# Patient Record
Sex: Male | Born: 1935 | Race: White | Hispanic: No | Marital: Married | Smoking: Former smoker
Health system: Southern US, Community
[De-identification: ages and names within clinical notes are randomized; demographics above are authoritative.]

## PROBLEM LIST (undated history)

## (undated) DIAGNOSIS — G4730 Sleep apnea, unspecified: Secondary | ICD-10-CM

## (undated) DIAGNOSIS — J449 Chronic obstructive pulmonary disease, unspecified: Secondary | ICD-10-CM

## (undated) DIAGNOSIS — I10 Essential (primary) hypertension: Secondary | ICD-10-CM

## (undated) DIAGNOSIS — E7800 Pure hypercholesterolemia, unspecified: Secondary | ICD-10-CM

## (undated) DIAGNOSIS — I272 Pulmonary hypertension, unspecified: Secondary | ICD-10-CM

## (undated) DIAGNOSIS — G629 Polyneuropathy, unspecified: Secondary | ICD-10-CM

## (undated) DIAGNOSIS — I495 Sick sinus syndrome: Secondary | ICD-10-CM

## (undated) DIAGNOSIS — I50810 Right heart failure, unspecified: Secondary | ICD-10-CM

## (undated) DIAGNOSIS — I2510 Atherosclerotic heart disease of native coronary artery without angina pectoris: Secondary | ICD-10-CM

## (undated) HISTORY — DX: Essential (primary) hypertension: I10

## (undated) HISTORY — DX: Atherosclerotic heart disease of native coronary artery without angina pectoris: I25.10

## (undated) HISTORY — DX: Right heart failure, unspecified: I50.810

## (undated) HISTORY — PX: OTHER SURGICAL HISTORY: SHX169

## (undated) HISTORY — DX: Pure hypercholesterolemia, unspecified: E78.00

## (undated) HISTORY — DX: Sleep apnea, unspecified: G47.30

## (undated) HISTORY — DX: Chronic obstructive pulmonary disease, unspecified: J44.9

## (undated) HISTORY — DX: Polyneuropathy, unspecified: G62.9

## (undated) HISTORY — DX: Sick sinus syndrome: I49.5

## (undated) HISTORY — DX: Pulmonary hypertension, unspecified: I27.20

---

## 1998-12-31 ENCOUNTER — Ambulatory Visit: Admission: RE | Admit: 1998-12-31 | Discharge: 1998-12-31 | Payer: Self-pay | Admitting: Family Medicine

## 1999-10-03 ENCOUNTER — Inpatient Hospital Stay (HOSPITAL_COMMUNITY): Admission: EM | Admit: 1999-10-03 | Discharge: 1999-10-10 | Payer: Self-pay | Admitting: Emergency Medicine

## 1999-10-03 ENCOUNTER — Encounter: Payer: Self-pay | Admitting: Emergency Medicine

## 1999-10-03 HISTORY — PX: CARDIAC CATHETERIZATION: SHX172

## 1999-10-04 ENCOUNTER — Encounter: Payer: Self-pay | Admitting: Cardiothoracic Surgery

## 1999-10-04 HISTORY — PX: CORONARY ARTERY BYPASS GRAFT: SHX141

## 1999-10-05 ENCOUNTER — Encounter: Payer: Self-pay | Admitting: Cardiothoracic Surgery

## 1999-10-06 ENCOUNTER — Encounter: Payer: Self-pay | Admitting: Cardiothoracic Surgery

## 1999-10-07 ENCOUNTER — Encounter: Payer: Self-pay | Admitting: Cardiothoracic Surgery

## 1999-10-08 ENCOUNTER — Encounter: Payer: Self-pay | Admitting: Cardiothoracic Surgery

## 1999-10-09 ENCOUNTER — Encounter: Payer: Self-pay | Admitting: Cardiothoracic Surgery

## 1999-10-10 ENCOUNTER — Encounter: Payer: Self-pay | Admitting: Cardiothoracic Surgery

## 1999-11-23 ENCOUNTER — Encounter (HOSPITAL_COMMUNITY): Admission: RE | Admit: 1999-11-23 | Discharge: 2000-02-21 | Payer: Self-pay | Admitting: Cardiology

## 2001-06-05 ENCOUNTER — Ambulatory Visit (HOSPITAL_COMMUNITY): Admission: RE | Admit: 2001-06-05 | Discharge: 2001-06-05 | Payer: Self-pay | Admitting: Gastroenterology

## 2008-04-11 ENCOUNTER — Ambulatory Visit (HOSPITAL_COMMUNITY): Admission: RE | Admit: 2008-04-11 | Discharge: 2008-04-12 | Payer: Self-pay | Admitting: *Deleted

## 2008-11-20 ENCOUNTER — Ambulatory Visit: Admission: RE | Admit: 2008-11-20 | Discharge: 2008-11-20 | Payer: Self-pay | Admitting: Orthopedic Surgery

## 2009-12-07 ENCOUNTER — Ambulatory Visit: Payer: Self-pay | Admitting: Cardiology

## 2010-03-01 ENCOUNTER — Encounter: Payer: Self-pay | Admitting: Internal Medicine

## 2010-03-15 ENCOUNTER — Ambulatory Visit: Payer: Self-pay | Admitting: Cardiology

## 2010-04-07 ENCOUNTER — Ambulatory Visit: Payer: Self-pay | Admitting: Internal Medicine

## 2010-05-25 NOTE — Miscellaneous (Signed)
Summary: Device preload  Clinical Lists Changes  Observations: Added new observation of PPM INDICATN: A-flutter (03/01/2010 8:58) Added new observation of MAGNET RTE: BOL 85 ERI 65 (03/01/2010 8:58) Added new observation of PPMLEADSTAT2: active (03/01/2010 8:58) Added new observation of PPMLEADSER2: [id number] (03/01/2010 8:58) Added new observation of PPMLEADMOD2: 5076  (03/01/2010 8:58) Added new observation of PPMLEADDOI2: 04/11/2008  (03/01/2010 8:58) Added new observation of PPMLEADLOC2: RV  (03/01/2010 8:58) Added new observation of PPMLEADSTAT1: active  (03/01/2010 8:58) Added new observation of PPMLEADSER1: [id number]  (03/01/2010 8:58) Added new observation of PPMLEADMOD1: 5076  (03/01/2010 8:58) Added new observation of PPMLEADDOI1: 04/11/2008  (03/01/2010 8:58) Added new observation of PPMLEADLOC1: RA  (03/01/2010 8:58) Added new observation of PPM DOI: 04/11/2008  (03/01/2010 8:58) Added new observation of PPM SERL#: [id number]  (03/01/2010 8:58) Added new observation of PPM MODL#: [id number]  (03/01/2010 8:58) Added new observation of PACEMAKERMFG: Medtronic  (03/01/2010 8:58) Added new observation of PPM IMP MD: Charlynn Court  (03/01/2010 8:58) Added new observation of PPM REFER MD: Vonna Drafts  (03/01/2010 8:58) Added new observation of PACEMAKER MD: Sherryl Manges, MD  (03/01/2010 8:58)      PPM Specifications Following MD:  Sherryl Manges, MD     Referring MD:  Vonna Drafts PPM Vendor:  Medtronic     PPM Model Number:  [id number]     PPM Serial Number:  [id number] PPM DOI:  04/11/2008     PPM Implanting MD:  Charlynn Court  Lead 1    Location: RA     DOI: 04/11/2008     Model #: [id number]     Serial #: [id number]     Status: active Lead 2    Location: RV     DOI: 04/11/2008     Model #: [id number]     Serial #: [id number]     Status: active  Magnet Response Rate:  BOL 85 ERI 65  Indications:  A-flutter

## 2010-05-27 NOTE — Procedures (Signed)
Summary: pacer check/ medtronic   Current Medications (verified): 1)  Pravastatin Sodium 40 Mg Tabs (Pravastatin Sodium) .... Take 2 Tablet By Mouth Once Daily 2)  Clonazepam 1 Mg Tabs (Clonazepam) .... Take 1/2 To 2 Tablet By Mouth in The Evening 3)  Metoprolol Tartrate 50 Mg Tabs (Metoprolol Tartrate) .... Take 1/2 Tablet By Mouth Two Times A Day 4)  Pradaxa 75 Mg Caps (Dabigatran Etexilate Mesylate) .... Take 1 Tablet By Mouth Two Times A Day 5)  Fish Oil 1000 Mg Caps (Omega-3 Fatty Acids) .... Take 1 Capsule By Mouth Two Times A Day 6)  Niacin 100 Mg Tabs (Niacin) .... Take 1 Tablet By Mouth Once Daily 7)  Centrum Silver  Tabs (Multiple Vitamins-Minerals) .... Take 1 Tablet By Mouth Once Daily 8)  Iron 325 (65 Fe) Mg Tabs (Ferrous Sulfate) .... Take 1 Tablet By Mouth Once Daily  Allergies (verified): No Known Drug Allergies  PPM Specifications Following MD:  Sherryl Manges, MD     Referring MD:  Vonna Drafts PPM Vendor:  Medtronic     PPM Model Number:  [id number]     PPM Serial Number:  [id number] PPM DOI:  04/11/2008     PPM Implanting MD:  Charlynn Court  Lead 1    Location: RA     DOI: 04/11/2008     Model #: [id number]     Serial #: [id number]     Status: active Lead 2    Location: RV     DOI: 04/11/2008     Model #: [id number]     Serial #: [id number]     Status: active  Magnet Response Rate:  BOL 85 ERI 65  Indications:  A-flutter   PPM Follow Up Battery Voltage:  2.99 V       PPM Device Measurements Atrium  Amplitude: 1.5 mV, Impedance: 648 ohms,  Right Ventricle  Amplitude: PACED mV, Impedance: 600 ohms, Threshold: 0.5 V at 0.4 msec  Episodes MS Episodes:  2     Percent Mode Switch:  100%     Ventricular High Rate:  0     Atrial Pacing:  1.0%     Ventricular Pacing:  96.1%  Parameters Mode:  MVP     Lower Rate Limit:  60     Upper Rate Limit:  130 Paced AV Delay:  200     Sensed AV Delay:  170 Next Cardiology Appt Due:  07/12/2010 Tech Comments:  [place] [facility] PT--- PT IN AF  100% OF TIME. + PRADAXA.  NORMAL DEVICE FUNCTION.  NO CHANGES MADE. PT WOULD LIKE TO BE ENROLLED IN CARELINK. ROV 07-12-10 @ 1330 W/SK.  PT TO START CARELINK TRANSMISSIONS AFTER OV W/SK. Vella Kohler  April 07, 2010 3:12 PM

## 2010-07-12 ENCOUNTER — Encounter: Payer: Self-pay | Admitting: Internal Medicine

## 2010-08-18 ENCOUNTER — Other Ambulatory Visit: Payer: Self-pay | Admitting: Family Medicine

## 2010-08-18 ENCOUNTER — Ambulatory Visit
Admission: RE | Admit: 2010-08-18 | Discharge: 2010-08-18 | Disposition: A | Discharge status: Home / Self Care | Payer: Medicare Other | Source: Ambulatory Visit | Attending: Family Medicine | Admitting: Family Medicine

## 2010-08-18 DIAGNOSIS — G629 Polyneuropathy, unspecified: Secondary | ICD-10-CM

## 2010-08-18 DIAGNOSIS — R52 Pain, unspecified: Secondary | ICD-10-CM

## 2010-08-21 ENCOUNTER — Encounter: Payer: Self-pay | Admitting: Internal Medicine

## 2010-08-24 ENCOUNTER — Encounter: Payer: Self-pay | Admitting: Internal Medicine

## 2010-08-24 ENCOUNTER — Ambulatory Visit (INDEPENDENT_AMBULATORY_CARE_PROVIDER_SITE_OTHER): Payer: Medicare Other | Admitting: Internal Medicine

## 2010-08-24 DIAGNOSIS — I4891 Unspecified atrial fibrillation: Secondary | ICD-10-CM

## 2010-08-24 DIAGNOSIS — I2510 Atherosclerotic heart disease of native coronary artery without angina pectoris: Secondary | ICD-10-CM | POA: Insufficient documentation

## 2010-08-24 DIAGNOSIS — I4892 Unspecified atrial flutter: Secondary | ICD-10-CM

## 2010-08-24 DIAGNOSIS — Z950 Presence of cardiac pacemaker: Secondary | ICD-10-CM | POA: Insufficient documentation

## 2010-08-24 DIAGNOSIS — R001 Bradycardia, unspecified: Secondary | ICD-10-CM

## 2010-08-24 DIAGNOSIS — I498 Other specified cardiac arrhythmias: Secondary | ICD-10-CM

## 2010-08-24 NOTE — Patient Instructions (Signed)
Your physician wants you to follow-up in: 1 year with Dr Logan Bores will receive a reminder letter in the mail two months in advance. If you don't receive a letter, please call our office to schedule the follow-up appointment.   Remote monitoring is used to monitor your Pacemaker of ICD from home. This monitoring reduces the number of office visits required to check your device to one time per year. It allows [place] to keep an eye on the functioning of your device to ensure it is working properly. You are scheduled for a device check from home on 11/25/2010. You may send your transmission at any time that day. If you have a wireless device, the transmission will be sent automatically. After your physician reviews your transmission, you will receive a postcard with your next transmission date.

## 2010-08-24 NOTE — Assessment & Plan Note (Signed)
He is 97% ventricularly paced

## 2010-08-24 NOTE — Assessment & Plan Note (Addendum)
Atrial fibrillation and permanent. Rate control is not an issue as he has high-grade heart block he is on Pradaxa

## 2010-08-24 NOTE — Assessment & Plan Note (Signed)
The patient's device was interrogated.  The information was reviewed. No changes were made in the programming.    

## 2010-08-24 NOTE — Assessment & Plan Note (Signed)
Stable on current medications 

## 2010-08-24 NOTE — Progress Notes (Signed)
HPI: VELDON WAGER is a 77 y.o. male Seen to establish pacemaker followup. He had a device implanted 2009 because of tachybradycardia syndrome. He received a Medtronic pacemaker.  He is totally reverted to atrial fibrillation. Cardioversion was attempted on one occasion. Following reversion to the atrial fibrillation it was elected to leave him in atrial fibrillation. He is anticoagulated with Pradaxa. He is tolerated up without GI symptoms.  He denies chest pain. He has chronic stable shortness of breath attributed to his COPD. Current Outpatient Prescriptions  Medication Sig Dispense Refill  . Albuterol Sulfate (PROAIR HFA IN) Inhale into the lungs as directed.        . clonazePAM (KLONOPIN) 1 MG tablet Take half a tablet to 2 tablets in the evening.       . dabigatran (PRADAXA) 150 MG CAPS Take 150 mg by mouth every 12 (twelve) hours.        . ferrous sulfate 325 (65 FE) MG tablet Take 325 mg by mouth daily.        . fish oil-omega-3 fatty acids 1000 MG capsule Take 1 g by mouth 2 (two) times daily.        . metoprolol (LOPRESSOR) 50 MG tablet Take 25 mg by mouth 2 (two) times daily.        . Multiple Vitamins-Minerals (CENTRUM) tablet Take 1 tablet by mouth daily.        . niacin 100 MG tablet Take 100 mg by mouth daily.        Marland Kitchen DISCONTD: dabigatran (PRADAXA) 75 MG CAPS Take 75 mg by mouth 2 (two) times daily.        Marland Kitchen DISCONTD: pravastatin (PRAVACHOL) 40 MG tablet Take 80 mg by mouth daily.          No Known Allergies  Past Medical History  Diagnosis Date  . HTN (hypertension)   . Hypercholesterolemia   . Sleep apnea     Past Surgical History  Procedure Date  . Coronary artery bypass graft 10/04/99    Family History  Problem Relation Age of Onset  . Heart attack Father     History   Social History  . Marital Status: Married    Spouse Name: N/A    Number of Children: N/A  . Years of Education: N/A   Occupational History  . Not on file.   Social History Main  Topics  . Smoking status: Former Games developer  . Smokeless tobacco: Not on file   Comment: quit smoking 31 yrs ago  . Alcohol Use: No  . Drug Use: Not on file  . Sexually Active: Not on file   Other Topics Concern  . Not on file   Social History Narrative  . No narrative on file  social history he is married he has 2 children and 3 grandchildren. 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 He does not use cigarettes  Family history is positive acquired  Fourteen point review of systems was negative except as noted in HPI and PMH except restless leg syndrome. He also has a cyst on his right kidney  PHYSICAL EXAMINATION  Blood pressure 128/76, pulse 74, height 5\' 10"  (1.778 m), weight 175 lb (79.379 kg).   Well developed and nourished older Caucasian male appearing his stated age in no acute distress HENT normal Neck supple with JVP-flat Carotids brisk and full without bruits Back without scoliosis or kyphosis Clear Regular rate and rhythm, S4present tAbd-soft with active BS without hepatomegaly or midline  pulsation Femoral pulses 2+ distal pulses intact No Clubbing cyanosis edema Skin-warm and dry LN-neg submandibular and supraclavicular A & Oriented CN 3-12 normal  Grossly normal sensory and motor function Affect engaging .

## 2010-09-10 NOTE — Op Note (Signed)
[place]. [facility]  Patient:    Roy Banks, Roy Banks                        MRN: [id number] Proc. Date: 10/04/99 Adm. Date:  [id number] Attending:  Waldo Laine Dictator:   Gwenith Daily Tyrone Sage, M.D. CC:         Gwenith Daily. Tyrone Sage, M.D.             Peter M. Swaziland, M.D.                           Operative Report  PREOPERATIVE DIAGNOSIS:  Coronary occlusive disease with unstable angina.  POSTOPERATIVE DIAGNOSIS:  Coronary occlusive disease with unstable angina.  PROCEDURE:  Coronary artery bypass grafting x 4 with left internal mammary to left anterior descending coronary artery, reverse saphenous vein graft to the diagonal coronary artery, reverse saphenous vein graft to the first obtuse marginal coronary artery, reverse saphenous vein graft to the distal right coronary artery.  SURGEON:  Dr. Tyrone Sage.  ASSISTANT:  Sherrie George, P.A.  HISTORY OF PRESENT ILLNESS:  The patient is a 77 year old male who presented with new onset of unstable anginal symptoms.  He had prior to admission an episode of prolonged pain, but without elevation of troponins.  He was stabilized medically, and underwent urgent cardiac catheterization.  At the time of catheterization he was found to have a large ectatic right coronary artery with a question of dissection in the vessel because of hang up of dye, and also a high grade, greater than 90% stenosis of the proximal left anterior descending artery and proximal circumflex.  Overall, left ventricular function was preserved.  The patient was seen in consultation by Dr. Laneta Simmers, and coronary artery bypass grafting was recommended.  The patient agreed and signed informed consent.  DESCRIPTION OF PROCEDURE:  The patient underwent general endotracheal anesthesia without incident.  Swan-Ganz and arterial line monitors had been placed.  The skin of the chest and legs was prepped with Betadine, draped in the usual sterile  manner.  Vein was harvested from the left lower extremity. The initial portion of the vein at the ankle was not usable, but the remainder of the vein was.  A median sternotomy was performed.  Left internal mammary artery was dissected down to the pedicle graft.  The distal artery was divided, had good free flow.  The pericardium was opened.  Overall ventricular function appeared preserved.  The patient was systemically heparinized.  The ascending aorta and the right atrium were cannula, and the aortic root then injected with Cardioplegia into the ascending aorta.  The patient was placed on cardiopulmonary bypass 2.4 L per minute per meter squared.  Anastomosis were selected and dissected out of the epicardium.  The patients body temperature was cooled to 30 degrees.  Aortic cross clamp was applied, 500 cc of cold blood passed, Cardioplegia was administered with rapid diastolic arrest of the heart.  Myocardial septal temperature was monitored throughout the cross clamp period.  Attention was turned first to the first obtuse marginal coronary artery.  This vessel was opened and measured 1.5 mm.  Using a running 7-0 Prolene was used for the anastomosis.  The second and third obtuse marginals which fed off the first one were less than 1 mm in size.  The first diagonal coronary artery was then opened, and in a similar fashion a similar reverse saphenous vein graft was  anastomosed to the diagonal with a running 7-0 Prolene.  Additional cold blood Cardioplegia was administered down the two vein grafts.  Attention was then turned to the distal right coronary artery which was opened and measured 1.5 mm.  Anastomosis to the distal right coronary artery was carried out with a segment of reverse saphenous vein graft and running 7-0 Prolene.  Additional cold blood Cardioplegia was administered down the vein graft.  Attention was then turned to the left anterior descending artery which was  a relatively small vessel.  The distal third of the vessel was opened, and measured 1 mm distally, and 1.5 mm proximally.  Using a running 8-0 Prolene, the left internal mammary artery was anastomosed to the left anterior descending artery.  With release on the mammary artery there was appropriate rise in myocardial septal temperature.  Aortic cross clamp was removed.  Total cross clamp time was 48 minutes.  The patient spontaneously converted to a sinus rhythm.  Partial occlusion clamp was placed on the ascending aorta. Three puncture aortotomies were performed.  Each of three vein grafts were anastomosed to the ascending aorta.  Air was evacuated from the grafts. Partial occlusion clamp was removed.  Sites of the anastomosis were inspected and were free of bleeding.  The patient was then ventilated and weaned from cardiopulmonary bypass without difficulty.  He remained hemodynamically stable.  He was decannulated in the usual fashion.  Protamine sulfate was administered with operative field.  Two atrial and two ventricular pacing wires were applied.  Graft markers were applied.  Left pleural tube and two mediastinal tubes were left in place.  Sternum was closed with #6 stainless steel wire, fascia closed with interrupted 0 Vicryl, running 3-0 Vicryl in subcutaneous tissue, and 4-0 subcuticular stitch in the skin edges.  Dry dressings were applied.  Sponge and needle counts were reported as correct. The patient tolerated the procedure without obvious complications. DD:  10/05/99 TD:  10/08/99 Job: [id number]

## 2010-09-10 NOTE — Discharge Summary (Signed)
[place]. [facility]  Patient:    Roy Banks, Roy Banks                        MRN: [id number] Adm. Date:  [id number] Disc. Date: [id number] Attending:  Waldo Laine Dictator:   Eugenia Pancoast, P.A. CC:         Gwenith Daily Tyrone Sage, M.D.             Peter M. Swaziland, M.D.             Hadassah Pais. Jeannetta Nap, M.D.             Mickle Asper, M.D.             Evelene Croon, M.D.                           Discharge Summary  FINAL DIAGNOSES: 1.  Coronary artery disease. 2.  Hypertension. 3.  Hyperlipidemia. 4.  Mild sleep apnea.  PROCEDURES:  October 03, 1999, left heart catheterization per Dr. Peter M. Swaziland.  October 04, 1999, coronary artery bypass x4 with LIMA to the LAD, saphenous vein graft to diagonal branch, a saphenous vein graft to first obtuse marginal, saphenous vein graft to posterior descending artery.  HISTORY OF PRESENT ILLNESS:  This is a 77 year old gentleman with a history of hypertension and hypercholesterolemia who presented with a two hour history of severe substernal chest pain.  The patients pain was described as mid sternal, severe pressure without radiation.  There was no nausea, vomiting, or shortness of breath.  The patient did have some diaphoresis.  His pain has waxed and waned in intensity.  Initially when he presented to the emergency room at [facility] his pain was 6/10.  An electrocardiogram was normal.  Subsequently his pain intensified to 10/10, and his EKG showed evidence of ST depression 2-3 mm anteriorly.  Despite intravenous nitroglycerin, heparin, and morphine, the patients pain had been incompletely relieved.  Of note, the patient did have a stress Cardiolite study in August, 2000 which was negative for ischemic disease after walking eight minutes on a Bruce protocol.  Because of these symptoms, he was admitted to [facility] per Dr. Peter Sw[place]iland.  HOSPITAL COURSE:  The patient was admitted to the hospital  on October 03, 1999 and started on heparin per heparin protocol.  He subsequently the same day underwent a left heart catheterization performed by Dr. Swaziland which revealed significant three vessel coronary artery disease and left main coronary artery disease and normal LV.  Because of these findings, he was referred to Dr. Kathlee Nations Trigt for surgical neovascularization.  Dr. Donata Clay saw the patient and discussed the surgery.  Risks and benefits were explained.  The patient understood and agreed to surgery.  On October 04, 1999, the patient underwent a coronary artery bypass x4 with LIMA to LAD, a saphenous vein graft to diagonal branch, a saphenous vein graft to the first obtuse marginal branch, and a saphenous vein graft to posterior descending artery.  The patient tolerated the procedure well.  No intraoperative complication occurred.  The patient had a cerebrovascular evaluation also done which showed no internal carotid artery stenosis.  The patient postoperatively did well. He was extubated the operative day, and first postoperative day he was doing quite well.  He was afebrile.  Vital signs were all stable at this time.  He continued to  progress in a satisfactory manner.  His mediastinal tube did have a mild air leak and subsequently was left in.  He was subsequently transferred to the step down unit on the third postoperative day, at which time there was no air leak noted, and the mediastinal tube was subsequently discontinued at that time.  He continued progressing in a satisfactory manner, going through his cardiac rehab phase without difficulty.  No untoward events occurred during his stay.  He subsequently improved and was prepared for discharge.  DISCHARGE MEDICATIONS: 1.  Lipitor 10 mg q day. 2.  Enteric-coated aspirin 325 mg. 3.  Lopressor 50 mg one/half tablet q 12 hours. 4.  Klonopin 0.5 mg hs. 5.  Tylox 150 p.o. q 4 - 6 h p.r.n. pain.  FINAL LABORATORY:  WBC 6.5,  hemoglobin 10.7, hematocrit 30.7, platelets 228,000.  Sodium 136, potassium 4.1, chloride 104, CO2 25, BUN 14, creatinine 0.9, glucose 104, calcium 8.0.  The patient was subsequently prepared for discharge and discharged home on October 10, 1999.  FOLLOW UP:  The patient will follow up with Dr. Peter Swaziland in two weeks.  He will see Dr. Kathlee Nations Trigt in three weeks.  The patient has staples in his leg incision.  These will be removed on Thursday, June 21 at 9:00 a.m.  The patients incision is well healing, satisfactory, no sign of infection was noted.  The patient is subsequently discharged home on October 10, 1999 in satisfactory, stable condition. DD:  10/08/99 TD:  10/12/99 Job: [id number]

## 2010-09-10 NOTE — Consult Note (Signed)
[place]. [facility]  Patient:    Roy Banks, Roy Banks                       MRN: [id number] Proc. Date: 10/03/99 Attending:  Alleen Borne, M.D. Dictator:   [id number] CC:         Peter M. Swaziland, M.D.             Alleen Borne, M.D.                          Consultation Report  REASON FOR CONSULTATION: Left main and severe three vessel coronary disease with unstable angina.  HISTORY OF PRESENT ILLNESS: The patient is a 77 year old white male with multiple cardiac risk factors, evaluated last fall for coronary disease due to exertional shortness of breath.  A stress Cardiolite examination at that time was negative for ischemia.  The patient now presents with acute onset of severe substernal chest pain that he rated 10/10.  The pain was present for approximately two hours.  There is no radiation.  There is no shortness of breath or nausea.  He was diaphoretic.  The degree of pain waxed and waned prior to arrival in the emergency room.  Electrocardiogram in the emergency room was initially normal but then subsequent electrocardiogram performed during increased pain showed evidence of anterior ischemia with 2-3 mm of ST depression.  Despite intravenous nitroglycerin, heparin, and morphine sulfate, the pain was not completely resolved and he was taken to the catheterization laboratory for urgent cardiac catheterization.  This showed severe three vessel and left main coronary disease.  The left main coronary artery had about 80% distal stenosis and there is about 90% proximal LAD stenosis at the takeoff of the diagonal branch.  The left circumflex appeared to be the culprit vessel, with a 99% stenosis at the trifurcation of three marginal branches.  The right coronary artery was a large ectatic diffusely diseased vessel.  There was no significant stenosis present in this.  Left ventricular function was normal.  In the catheterization laboratory the patient  became pain-free and has had no pain since.  PAST MEDICAL HISTORY:  1. Hypercholesterolemia.  2. Hypertension.  3. History of mild sleep apnea.  4. History of fractured right leg requiring open reduction and internal     fixation.  REVIEW OF SYSTEMS: CONSTITUTIONAL: He denies fever or chills.  He has had no weight loss or weight gain.  EYES: Negative.  ENT: Negative.  CARDIOVASCULAR: Positive for substernal chest pain as above.  He has had exertional shortness of breath since last summer.  He has no PND or orthopnea.  He denies palpitations.  RESPIRATORY: He denies cough or sputum production.  GI: He has no melena or bright red blood per rectum.  He has had no nausea or vomiting. He denies dysphagia.  He has had normal bowel movements.  GU: He denies dysuria or hematuria.  NEUROLOGIC: He has had no focal weakness or numbness. He denies dizziness or syncope.  PSYCHIATRIC: Negative.  HEMATOLOGIC: He denies any history of bleeding disorder or easy bleeding.  ENDOCRINE: He has had no diabetes or hypothyroidism.  MUSCULOSKELETAL: Negative.  LYMPHATICS: Negative.  ALLERGIES: No known drug allergies.  CURRENT MEDICATIONS:  1. Prinivil 10 mg q.d.  2. Lipitor 10 mg q.d.  3. Clonazepam 0.5 mg q.h.s. p.r.n. sleep.  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  He is married.  He quit smoking about 20 years ago but continues to chew tobacco off and on.  He denies alcohol use.  FAMILY HISTORY: His father died of MI at age 42 and his mother is age 57 and alive, having had angioplasty.  PHYSICAL EXAMINATION:  VITAL SIGNS: Blood pressure 105/55, pulse 60 and regular, respiratory rate 18 and unlabored.  GENERAL: He is a well-developed white male in no distress.  HEENT: Head normocephalic, atraumatic.  PERRLA.  EOMI.  Throat clear.  NECK: Normal carotid pulses bilaterally.  No bruits.  No adenopathy or thyromegaly.  CARDIAC: Regular rate  and rhythm with normal S1 and S2.  No murmurs, rubs, or gallops.  LUNGS: Clear.  ABDOMEN: Active bowel sounds.  Abdomen soft, flat, and nontender.  No palpable masses or organomegaly.  EXTREMITIES: No peripheral edema.  Pedal pulses palpable bilaterally.  Some deformity of the right lower leg noted secondary to fracture.  NEUROLOGIC: He is alert and oriented x 3.  Motor and sensory examinations are grossly normal.  SKIN: Warm and dry.  IMPRESSION: This patient has severe left main and three vessel coronary disease with unstable angina symptoms.  The left circumflex stenosis appears to be the culprit vessel.  RECOMMENDATIONS: I agree that coronary artery bypass graft surgery is the best treatment for this patient.  He is now pain-free and if he remains so we will plan to do the surgery tomorrow morning.  If he has any further chest pain then he should have urgent coronary artery bypass graft surgery tonight.  I discussed the operative procedure with the patient and his family including alternatives, benefits, and risks, including bleeding, possible blood transfusion, infection, stroke, myocardial infarction, and death.  They understand and would like to proceed with surgery. DD:  10/03/99 TD:  10/04/99 Job: [id number]

## 2010-09-10 NOTE — Cardiovascular Report (Signed)
[place]. [facility]  Patient:    Roy Banks, Roy Banks                          MRN: [id number] Proc. Date: 10/03/99 Attending:  Peter M. Swazil[place], M.D. CC:         Hadassah Pais. Jeannetta Nap, M.D.             Alvia Grove., M.D.             Alleen Borne, M.D.                        Cardiac Catheterization  INDICATION FOR PROCEDURE:  The patient is a 77 year old white male who presents with acute coronary syndrome.  He has refractory chest pain and dynamic ECG changes despite IV heparin and nitroglycerin.  Initial CPK is elevated.  ACCESS:  Via the right femoral artery using the standard Seldinger technique.  EQUIPMENT:  A 6 French 4 cm right and left Judkins catheter, 7 French arterial sheath, 6 French pigtail catheter.  MEDICATIONS:  Local anesthesia with 1% Xylocaine.  CONTRAST:  Omnipaque 100 cc.  COMMENTARY:  The patient tolerated the procedure well without complications. He was given an addition 300 unit IV heparin bolus at the end of the procedure.  HEMODYNAMIC DATA:  Aortic pressure is 102/59 with a mean of 78 mmHg.  Left ventricular pressure is 103 with EDP of 24 mmHg.  ANGIOGRAPHIC DATA:  Left coronary artery:  The left coronary artery arises and distributes normally.  Left main:  The left main coronary artery has a 50% stenosis proximally.  The distal left main is narrowed to 70-80%.  Left anterior descending:  The left anterior descending artery is severely diseased proximally with a 90% segmental stenosis prior to the takeoff in the first diagonal branch.  In the mid LAD there is severe systolic compression of the vessel.  This segment appears relatively normal during diastole but during systole is almost completely obliterated by systolic compression.  Left circumflex:  The left circumflex coronary artery is a large vessel.  It has a 99% ulcerated stenosis proximally prior to trifurcation and to three marginal vessels.  Right coronary  artery:  The right coronary artery is a large dominant vessel. It is diffusely ectatic with diffuse irregular plaque.  There is approximately a 40% stenosis proximally but no other obstructive lesions are seen.  LEFT VENTRICULAR ANGIOGRAPHY:  The left ventricular angiography is performed in the RAO view.  This demonstrates normal left ventricular size and normal contractility with normal left ventricular function.  Ejection fraction is estimated at 65%.  There is no significant mitral regurgitation or prolapse. The aortic valve appears normal.  FINAL INTERPRETATION: 1. Severe left main and two-vessel obstructive atherosclerotic coronary    artery disease. 2. Diffuse ectasia of the right coronary artery. 3. Normal left ventricular function.  PLAN:  At this point, the patient was pain-free and hemodynamically stable. We maintained him on IV heparin and nitroglycerin and left his arterial sheath in place.  Cardiac surgery consultation was obtained. DD:  10/03/99 TD:  10/06/99 Job: [id number]

## 2010-09-10 NOTE — Procedures (Signed)
[place]. [facility]  Patient:    GIANLUCA, CHHIM Visit Number: [id number] MRN: [id number]          Service Type: END Location: ENDO Attending Physician:  Dennison Bulla Ii Dictated by:   Verlin Grills, M.D. Proc. Date: 06/05/01 Admit Date:  06/05/2001 Discharge Date: 06/05/2001   CC:         Hadassah Pais. Jeannetta Nap, M.D.                           Procedure Report  DATE OF BIRTH:  February 14, 1931  REFERRING PHYSICIAN:  Buren Kos, M.D.  PROCEDURE PERFORMED:  Colonoscopy.  ENDOSCOPIST:  Verlin Grills, M.D.  INDICATIONS FOR PROCEDURE:  The patient is a 77 year old male born 18-Jan-1931.  Mr. Vonbehren experienced a week-long episode of painless hematochezia initiated by a diarrheal type illness.  His diarrhea and hematochezia have resolved and he denies anorectal pain.  Mr. Stcyr viewed our colonoscopy education film in my office and I have discussed with the patient the complications associated with colonoscopy and polypectomy including a 15 per 1000 risk of bleeding and 4 per 1000 risk of colon perforation requiring surgical repair.  The patient has signed the operative permit.  MEDICATION ALLERGIES:  None.  CHRONIC MEDICATIONS: 1. Metoprolol. 2. Lipitor. 3. Aspirin. 4. Centrum Silver vitamins. 5. Tums.  PAST MEDICAL HISTORY:  Hypertension, hypercholesterolemia, coronary artery bypass grafting surgery October 04, 1999.  FAMILY HISTORY:  Negative for colon cancer.  SOCIAL HISTORY:  Mr. Cuervo 20 year old wife is in good health.  His 25 year old son and 82 year old son are in good health.  PREMEDICATION:  Versed 5 mg, Demerol 50 mg.  ENDOSCOPE:  Olympus pediatric colonoscope.  DESCRIPTION OF PROCEDURE:  After obtaining informed consent, the patient was placed in the left lateral decubitus position.  I administered intravenous Demerol and intravenous Versed to achieve conscious sedation for the procedure.  The patients blood  pressure, oxygen saturation and cardiac rhythm were monitored throughout the procedure and documented in the medical record.  Anal inspection was normal.  Digital rectal exam revealing non-nodular prostate.  The Olympus pediatric video colonoscope was then introduced into the rectum and easily advanced to the cecum.  A normal-appearing ileocecal valve was intubated and the distal ileum inspected.  Colonic preparation for the exam today was excellent.  Rectum:  Normal.  Large nonbleeding internal hemorrhoids are noted and are probably the etiology of Mr. Lambes hematochezia.  Sigmoid colon and descending colon:  Left colonic diverticulosis.  Splenic flexure:  Normal.  Transverse colon:  Normal.  Hepatic flexure:  Normal.  Ascending colon:  Normal.  Cecum and ileocecal valve:  Normal.  Distal ileum:  Normal.  ASSESSMENT: 1. Large nonbleeding internal hemorrhoids. 2. Left colonic diverticulosis. 3. Otherwise normal proctocolonoscopy to the cecum.  No endoscopic evidence    for the presence of colorectal neoplasia. Dictated by:   Verlin Grills, M.D. Attending Physician:  Dennison Bulla Ii DD:  06/05/01 TD:  06/05/01 Job: [id number]

## 2010-09-10 NOTE — H&P (Signed)
[place]. [facility]  Patient:    Banks, Roy                           MRN: [id number] Adm. Date:  10/03/99 Attending:  Peter M. [place]azil[place], M.D. CC:         Vesta Mixer, Montez Hageman., M.D.             Hadassah Pais. Jeannetta Nap, M.D.                         History and Physical  CHIEF COMPLAINT:  Chest pain.  HISTORY OF PRESENT ILLNESS:  [name] is a 77 year old white male with a history of hypertension and hypercholesterolemia who presents with a two hour history of severe substernal chest pain.  The patients pain is described as midsternal severe pressure without radiation.  There is no nausea, vomiting, or shortness of breath.  He has had diaphoresis.  His pain has waxed and waned in intensity.  Initially when he presented to the emergency room his pain was 6/10.  An ECG was normal.  Subsequently his pain has intensified to 10/10 and his EKG showed evidence of ST depression 2-3 mm anteriorly.  Despite IV nitroglycerin, heparin, and morphine, the patients pain has been incompletely relieved.  Of note, the patient did have a stress Cardiolite study in August of 2000 which was negative for ischemia after walking eight minutes on the Bruce protocol.  PAST MEDICAL HISTORY:  Significant for mild sleep apnea, history of hypercholesterolemia, hypertension.  Patient has a broken right leg.  ALLERGIES:  No known allergies.  MEDICATIONS: 1. Prinivil 10 mg per day. 2. Lipitor 10 mg per day. 3. Clonazepam 0.5 mg q.h.s. p.r.n.  SOCIAL HISTORY:  Patient works part time at [employer].  He quit smoking 20 years ago.  He is married.  He denies alcohol use.  FAMILY HISTORY:  Father died at age 13 with myocardial infarctions.  Mother is age 63 and has had previous angioplasty.  REVIEW OF SYSTEMS:  Unremarkable.  PHYSICAL EXAMINATION:  GENERAL:  Patient is a pleasant in moderate distress.  VITAL SIGNS:  Blood pressure is 140/100, pulse 59, respirations  20.  HEENT:  Unremarkable.  NECK:  He has no JVD, bruits, or thyromegaly.  LUNGS:  Clear.  HEART:  Cardiac exam was without gallops, murmurs, rubs, or clicks.  EXTREMITIES:  Pulses are 2+ and symmetrical.  He has no edema.  NEUROLOGIC:  Nonfocal.  LABORATORY DATA:  CK is 586, MB is pending.  Initial ECG was normal. Subsequent ECG showed ST depression of 2-3 mm anteriorly, consistent with subendocardial injury.  IMPRESSION: 1. Acute coronary syndrome with refractory chest pain, dynamic ECG changes. 2. Hypertension. 3. Hypercholesterolemia. 4. Family history of coronary disease. 5. Sleep apnea.  PLAN:  The patient is treated emergently with aspirin and IV nitroglycerin, heparin, and ReoPro.  He will be taken for emergent cardiac catheterization and possible intervention. DD:  10/03/99 TD:  10/03/99 Job: [id number]

## 2010-11-25 ENCOUNTER — Ambulatory Visit (INDEPENDENT_AMBULATORY_CARE_PROVIDER_SITE_OTHER): Payer: Medicare Other | Admitting: *Deleted

## 2010-11-25 ENCOUNTER — Other Ambulatory Visit: Payer: Self-pay | Admitting: Internal Medicine

## 2010-11-25 DIAGNOSIS — R001 Bradycardia, unspecified: Secondary | ICD-10-CM

## 2010-11-25 DIAGNOSIS — Z950 Presence of cardiac pacemaker: Secondary | ICD-10-CM

## 2010-11-25 DIAGNOSIS — I498 Other specified cardiac arrhythmias: Secondary | ICD-10-CM

## 2010-11-25 DIAGNOSIS — I4891 Unspecified atrial fibrillation: Secondary | ICD-10-CM

## 2010-11-25 LAB — REMOTE PACEMAKER DEVICE
BAMS-0001: 170 {beats}/min
BRDY-0002RA: 60 {beats}/min
BRDY-0003RA: 130 {beats}/min
BRDY-0004RA: 115 {beats}/min

## 2010-11-26 ENCOUNTER — Encounter: Payer: Self-pay | Admitting: *Deleted

## 2010-12-01 NOTE — Progress Notes (Signed)
Pacer remote check  

## 2010-12-02 ENCOUNTER — Encounter: Payer: Self-pay | Admitting: Cardiology

## 2010-12-02 ENCOUNTER — Ambulatory Visit (INDEPENDENT_AMBULATORY_CARE_PROVIDER_SITE_OTHER): Payer: Medicare Other | Admitting: Cardiology

## 2010-12-02 DIAGNOSIS — I2510 Atherosclerotic heart disease of native coronary artery without angina pectoris: Secondary | ICD-10-CM

## 2010-12-02 DIAGNOSIS — E785 Hyperlipidemia, unspecified: Secondary | ICD-10-CM

## 2010-12-02 DIAGNOSIS — I4891 Unspecified atrial fibrillation: Secondary | ICD-10-CM

## 2010-12-02 NOTE — Patient Instructions (Signed)
Continue your current medications.  I will ask for a copy of your lab work from Dr. Jeannetta Nap.  I will see you again in 6 months.

## 2010-12-02 NOTE — Assessment & Plan Note (Signed)
He had a normal stress Cardiolite study in April 2010. We'll continue with his medical therapy and plan a followup again in 6 months. He has any significant chest discomfort we will consider a followup stress test.

## 2010-12-02 NOTE — Progress Notes (Signed)
Roy Banks Date of Birth: 1930-12-09   History of Present Illness: Roy Banks is seen for followup today. He states he's had a very vague discomfort in his mid chest on a couple of occasions but generally has done very well. He denies any palpitations, tachycardia, or dizziness. He states that his pravastatin was stopped 3 months ago but he doesn't know why. He has been diagnosed with some type of neuropathy. He is seeing neurology for this.  Current Outpatient Prescriptions on File Prior to Visit  Medication Sig Dispense Refill  . Albuterol Sulfate (PROAIR HFA IN) Inhale into the lungs as directed.        . clonazePAM (KLONOPIN) 1 MG tablet Take half a tablet to 2 tablets in the evening.       . dabigatran (PRADAXA) 150 MG CAPS Take 150 mg by mouth every 12 (twelve) hours.        . ferrous sulfate 325 (65 FE) MG tablet Take 325 mg by mouth daily.        . fish oil-omega-3 fatty acids 1000 MG capsule Take 1 g by mouth 2 (two) times daily.        . metoprolol (LOPRESSOR) 50 MG tablet Take 25 mg by mouth 2 (two) times daily.        . Multiple Vitamin (MULTI-VITAMIN PO) Take by mouth daily.        . niacin 100 MG tablet Take 100 mg by mouth daily.        Marland Kitchen tiotropium (SPIRIVA) 18 MCG inhalation capsule Place 18 mcg into inhaler and inhale daily.        . pravastatin (PRAVACHOL) 80 MG tablet Take 80 mg by mouth daily.          No Known Allergies  Past Medical History  Diagnosis Date  . HTN (hypertension)   . Hypercholesterolemia   . Sleep apnea   . COPD (chronic obstructive pulmonary disease)   . Coronary artery disease     post CABG x4 in June, 2001  . Tachy-brady syndrome   . Atrial fibrillation     post DDD pacemaker implant  . Peripheral neuropathy     Past Surgical History  Procedure Date  . Coronary artery bypass graft 10/04/1999    x4 -- LIMA graft to LAD, saphenous vein graft to the diagonal, saphenous vein graft to the first OM vessel, and saphenous vein graft to the  distal RCA  . Cardiac catheterization 10/03/1999    Est. EF of 65% -- Severe left main and two-vessel obstructive atherosclerotic coronary  -- Diffuse ectasia of the right coronary artery -- Normal left ventricular function    History  Smoking status  . Former Smoker -- 1.0 packs/day for 32 years  . Types: Cigarettes  . Quit date: 11/26/1982  Smokeless tobacco  . Former Neurosurgeon  . Types: Chew  . Quit date: 11/26/2002  Comment: quit smoking 31 yrs ago    History  Alcohol Use No    Family History  Problem Relation Age of Onset  . Heart attack Father   . Heart attack Mother 66    and has had previous angioplasty    Review of Systems: The review of systems is positive for numbness in his lower extremities. He has no significant pain. His last pacemaker check in early May was normal..  All other systems were reviewed and are negative.  Physical Exam: BP 140/72  Pulse 60  Ht 5\' 8"  (1.727 m)  Wt  177 lb 12.8 oz (80.65 kg)  BMI 27.03 kg/m2 He is a pleasant white male in no acute distress. His HEENT exam is unremarkable. He has no JVD or bruits. Lungs are clear. Cardiac exam reveals a regular rate and rhythm with a grade 1-2/6 systolic murmur in the apex. Abdomen is soft and nontender without masses or bruits. Extremities are without edema. Pedal pulses are good. His neurologic exam is nonfocal. LABORATORY DATA: Date August 20, 2010 his TSH was 4.89. Glucose 91. Total cholesterol 156, triglycerides 88, HDL 50, and LDL 88. Transaminases were normal.  Assessment / Plan:

## 2010-12-02 NOTE — Assessment & Plan Note (Signed)
The reason for stopping his statin therapy is unknown. He is on niacin. I will defer this to his primary care.

## 2010-12-02 NOTE — Assessment & Plan Note (Signed)
Rate is well controlled since he has severe heart block. He is status post pacemaker implant with stable pacemaker function. He is on chronic anticoagulation with Pradaxa.

## 2010-12-31 ENCOUNTER — Ambulatory Visit (INDEPENDENT_AMBULATORY_CARE_PROVIDER_SITE_OTHER): Payer: Medicare Other | Admitting: *Deleted

## 2010-12-31 ENCOUNTER — Encounter: Payer: Self-pay | Admitting: Internal Medicine

## 2010-12-31 DIAGNOSIS — I4892 Unspecified atrial flutter: Secondary | ICD-10-CM

## 2010-12-31 LAB — PACEMAKER DEVICE OBSERVATION: BAMS-0001: 170 {beats}/min

## 2010-12-31 NOTE — Progress Notes (Signed)
pacer checked in device clinic.

## 2011-01-03 ENCOUNTER — Encounter: Payer: Medicare Other | Admitting: *Deleted

## 2011-01-18 ENCOUNTER — Other Ambulatory Visit: Payer: Self-pay | Admitting: Cardiology

## 2011-01-19 ENCOUNTER — Other Ambulatory Visit: Payer: Self-pay | Admitting: Cardiology

## 2011-01-19 ENCOUNTER — Other Ambulatory Visit: Payer: Self-pay | Admitting: *Deleted

## 2011-01-19 MED ORDER — DABIGATRAN ETEXILATE MESYLATE 150 MG PO CAPS: 150.0000 mg | ORAL_CAPSULE | Freq: Two times a day (BID) | ORAL | Status: DC

## 2011-01-19 NOTE — Telephone Encounter (Signed)
escribe medication per fax request  

## 2011-01-19 NOTE — Telephone Encounter (Signed)
Pt wants refill pradaxa called to [facility] on [address]

## 2011-01-19 NOTE — Telephone Encounter (Signed)
Fax received from pharmacy. Refill completed. Jodette Skylan Gift RN  

## 2011-01-27 LAB — GLUCOSE, CAPILLARY: Glucose-Capillary: 91 mg/dL (ref 70–99)

## 2011-02-24 ENCOUNTER — Encounter: Payer: Medicare Other | Admitting: *Deleted

## 2011-03-01 ENCOUNTER — Encounter: Payer: Self-pay | Admitting: *Deleted

## 2011-03-05 ENCOUNTER — Encounter: Payer: Self-pay | Admitting: Internal Medicine

## 2011-03-05 ENCOUNTER — Other Ambulatory Visit: Payer: Self-pay | Admitting: Internal Medicine

## 2011-03-07 ENCOUNTER — Ambulatory Visit (INDEPENDENT_AMBULATORY_CARE_PROVIDER_SITE_OTHER): Payer: Medicare Other | Admitting: *Deleted

## 2011-03-07 DIAGNOSIS — R001 Bradycardia, unspecified: Secondary | ICD-10-CM

## 2011-03-07 DIAGNOSIS — I498 Other specified cardiac arrhythmias: Secondary | ICD-10-CM

## 2011-03-07 DIAGNOSIS — Z950 Presence of cardiac pacemaker: Secondary | ICD-10-CM

## 2011-03-07 DIAGNOSIS — I4891 Unspecified atrial fibrillation: Secondary | ICD-10-CM

## 2011-03-13 LAB — REMOTE PACEMAKER DEVICE
AL IMPEDENCE PM: 640 Ohm
BMOD-0003RV: 30
RV LEAD IMPEDENCE PM: 568 Ohm

## 2011-03-16 NOTE — Progress Notes (Signed)
Remote pacer check  

## 2011-03-31 ENCOUNTER — Encounter: Payer: Self-pay | Admitting: *Deleted

## 2011-04-22 ENCOUNTER — Encounter: Payer: Self-pay | Admitting: Internal Medicine

## 2011-06-06 ENCOUNTER — Ambulatory Visit (INDEPENDENT_AMBULATORY_CARE_PROVIDER_SITE_OTHER): Payer: Medicare Other | Admitting: Cardiology

## 2011-06-06 ENCOUNTER — Ambulatory Visit (INDEPENDENT_AMBULATORY_CARE_PROVIDER_SITE_OTHER): Payer: Medicare Other | Admitting: *Deleted

## 2011-06-06 ENCOUNTER — Encounter: Payer: Self-pay | Admitting: Internal Medicine

## 2011-06-06 ENCOUNTER — Encounter: Payer: Self-pay | Admitting: Cardiology

## 2011-06-06 VITALS — BP 116/74 | HR 43 | Ht 70.0 in | Wt 177.2 lb

## 2011-06-06 DIAGNOSIS — I4891 Unspecified atrial fibrillation: Secondary | ICD-10-CM

## 2011-06-06 DIAGNOSIS — R5383 Other fatigue: Secondary | ICD-10-CM

## 2011-06-06 DIAGNOSIS — Z950 Presence of cardiac pacemaker: Secondary | ICD-10-CM

## 2011-06-06 DIAGNOSIS — I498 Other specified cardiac arrhythmias: Secondary | ICD-10-CM

## 2011-06-06 DIAGNOSIS — I2510 Atherosclerotic heart disease of native coronary artery without angina pectoris: Secondary | ICD-10-CM

## 2011-06-06 DIAGNOSIS — R001 Bradycardia, unspecified: Secondary | ICD-10-CM

## 2011-06-06 LAB — PACEMAKER DEVICE OBSERVATION
AL IMPEDENCE PM: 600 Ohm
BATTERY VOLTAGE: 2.97 V
BMOD-0004RV: 5
BRDY-0004RV: 115 {beats}/min
RV LEAD AMPLITUDE: 16.809 mv

## 2011-06-06 NOTE — Progress Notes (Signed)
Pacer check in clinic  

## 2011-06-06 NOTE — Assessment & Plan Note (Signed)
He has permanent atrial fibrillation. He will remain on pradaxa and rate control.

## 2011-06-06 NOTE — Assessment & Plan Note (Signed)
Clearly his paced rate is much lower than his lower rate limit. We interrogated his pacemaker today and demonstrated that he was over sensing his T wave. Sensitivity was adjusted. This should restore normal rate. I suspect that his shortness of breath and lightheadedness are related to his abnormally slow heart rate.

## 2011-06-06 NOTE — Patient Instructions (Signed)
Continue your current medications  I will see you again in 6 months.   

## 2011-06-06 NOTE — Progress Notes (Signed)
Roy Banks Date of Birth: 02/23/1931   History of Present Illness: Roy Banks is seen for followup today. He reports that he has had a recently upper respiratory infection. He is on antibiotics. He has had no significant chest pain. He denies any dizziness but has noticed occasional lightheadedness and does complain of shortness of breath when he walks up steps. He denies any palpitations.  Current Outpatient Prescriptions on File Prior to Visit  Medication Sig Dispense Refill  . Albuterol Sulfate (PROAIR HFA IN) Inhale into the lungs as directed.        . clonazePAM (KLONOPIN) 1 MG tablet Take half a tablet to 2 tablets in the evening.       . dabigatran (PRADAXA) 150 MG CAPS Take 1 capsule (150 mg total) by mouth every 12 (twelve) hours.  60 capsule  5  . ferrous sulfate 325 (65 FE) MG tablet Take 325 mg by mouth daily.        . fish oil-omega-3 fatty acids 1000 MG capsule Take 1 g by mouth 2 (two) times daily.        . metoprolol (LOPRESSOR) 50 MG tablet Take 25 mg by mouth 2 (two) times daily.        . Multiple Vitamin (MULTI-VITAMIN PO) Take by mouth daily.        . niacin 100 MG tablet Take 100 mg by mouth daily.        Marland Kitchen tiotropium (SPIRIVA) 18 MCG inhalation capsule Place 18 mcg into inhaler and inhale daily.          No Known Allergies  Past Medical History  Diagnosis Date  . HTN (hypertension)   . Hypercholesterolemia   . Sleep apnea   . COPD (chronic obstructive pulmonary disease)   . Coronary artery disease     post CABG x4 in June, 2001  . Tachy-brady syndrome   . Atrial fibrillation     post DDD pacemaker implant  . Peripheral neuropathy     Past Surgical History  Procedure Date  . Coronary artery bypass graft 10/04/1999    x4 -- LIMA graft to LAD, saphenous vein graft to the diagonal, saphenous vein graft to the first OM vessel, and saphenous vein graft to the distal RCA  . Cardiac catheterization 10/03/1999    Est. EF of 65% -- Severe left main and  two-vessel obstructive atherosclerotic coronary  -- Diffuse ectasia of the right coronary artery -- Normal left ventricular function    History  Smoking status  . Former Smoker -- 1.0 packs/day for 32 years  . Types: Cigarettes  . Quit date: 11/26/1982  Smokeless tobacco  . Former Neurosurgeon  . Types: Chew  . Quit date: 11/26/2002  Comment: quit smoking 31 yrs ago    History  Alcohol Use No    Family History  Problem Relation Age of Onset  . Heart attack Father   . Heart attack Mother 40    and has had previous angioplasty    Review of Systems: As noted in history of present illness.  All other systems were reviewed and are negative.  Physical Exam: BP 116/74  Pulse 43  Ht 5\' 10"  (1.778 m)  Wt 177 lb 3.2 oz (80.377 kg)  BMI 25.43 kg/m2 He is a pleasant white male in no acute distress. His HEENT exam is unremarkable. He has no JVD or bruits. Lungs are clear. Cardiac exam reveals a regular rate and rhythm with a grade 1-2/6 systolic murmur in  the apex. Abdomen is soft and nontender without masses or bruits. Extremities are without edema. Pedal pulses are good. His neurologic exam is nonfocal. LABORATORY DATA: ECG today demonstrates a paced ventricular rhythm at a rate of 43 beats per minute. He has underlying atrial fibrillation. Assessment / Plan:

## 2011-06-06 NOTE — Assessment & Plan Note (Signed)
He had a normal stress Cardiolite study in April 2010. We will continue with his medical therapy and risk factor modification. I will followup again in 6 months.

## 2011-06-09 ENCOUNTER — Encounter: Payer: Medicare Other | Admitting: *Deleted

## 2011-07-26 ENCOUNTER — Other Ambulatory Visit: Payer: Self-pay | Admitting: *Deleted

## 2011-07-26 MED ORDER — DABIGATRAN ETEXILATE MESYLATE 150 MG PO CAPS: 150.0000 mg | ORAL_CAPSULE | Freq: Two times a day (BID) | ORAL | Status: DC

## 2011-09-12 ENCOUNTER — Encounter: Payer: Medicare Other | Admitting: Internal Medicine

## 2011-09-20 ENCOUNTER — Ambulatory Visit (INDEPENDENT_AMBULATORY_CARE_PROVIDER_SITE_OTHER): Payer: Medicare Other | Admitting: Internal Medicine

## 2011-09-20 ENCOUNTER — Encounter: Payer: Self-pay | Admitting: Internal Medicine

## 2011-09-20 VITALS — BP 120/78 | HR 64 | Ht 70.0 in | Wt 177.1 lb

## 2011-09-20 DIAGNOSIS — I4891 Unspecified atrial fibrillation: Secondary | ICD-10-CM

## 2011-09-20 DIAGNOSIS — Z950 Presence of cardiac pacemaker: Secondary | ICD-10-CM

## 2011-09-20 DIAGNOSIS — R0989 Other specified symptoms and signs involving the circulatory and respiratory systems: Secondary | ICD-10-CM

## 2011-09-20 DIAGNOSIS — I2510 Atherosclerotic heart disease of native coronary artery without angina pectoris: Secondary | ICD-10-CM

## 2011-09-20 DIAGNOSIS — R0600 Dyspnea, unspecified: Secondary | ICD-10-CM | POA: Insufficient documentation

## 2011-09-20 LAB — PACEMAKER DEVICE OBSERVATION
AL IMPEDENCE PM: 656 Ohm
BATTERY VOLTAGE: 2.97 V
BMOD-0002RV: 7
BMOD-0003RV: 30
RV LEAD IMPEDENCE PM: 592 Ohm
VENTRICULAR PACING PM: 97.9

## 2011-09-20 MED ORDER — FUROSEMIDE 20 MG PO TABS: 20.0000 mg | ORAL_TABLET | Freq: Every day | ORAL | Status: DC

## 2011-09-20 NOTE — Progress Notes (Signed)
  HPI  Roy Banks is a 78 y.o. male Seen to establish pacemaker followup. He had a device implanted 2009 because of tachybradycardia syndrome. He received a [employer] pacemaker.   He has a history of ischemic heart disease with prior bypass surgery  Last myoview April 2010 without ischemia  His major complaint is of resting dyspnea on exertion. He denies orthopnea or nocturnal dyspnea. He is unaware of his peripheral edema. He denies chest pain. Marland Kitchen He is  reverted to atrial fibrillation. Cardioversion was attempted on one occasion. Following reversion to the atrial fibrillation it was elected to leave him in atrial fibrillation.  He is anticoagulated with Pradaxa. He is tolerated up without GI symptoms.      Past Medical History  Diagnosis Date  . HTN (hypertension)   . Hypercholesterolemia   . Sleep apnea   . COPD (chronic obstructive pulmonary disease)   . Coronary artery disease     post CABG x4 in June, 2001  . Tachy-brady syndrome   . Atrial fibrillation     post DDD pacemaker implant  . Peripheral neuropathy     Past Surgical History  Procedure Date  . Coronary artery bypass graft 10/04/1999    x4 -- LIMA graft to LAD, saphenous vein graft to the diagonal, saphenous vein graft to the first OM vessel, and saphenous vein graft to the distal RCA  . Cardiac catheterization 10/03/1999    Est. EF of 65% -- Severe left main and two-vessel obstructive atherosclerotic coronary  -- Diffuse ectasia of the right coronary artery -- Normal left ventricular function    Current Outpatient Prescriptions  Medication Sig Dispense Refill  . Albuterol Sulfate (PROAIR HFA IN) Inhale into the lungs as directed.        . clonazePAM (KLONOPIN) 1 MG tablet Take half a tablet to 2 tablets in the evening.       . dabigatran (PRADAXA) 150 MG CAPS Take 1 capsule (150 mg total) by mouth every 12 (twelve) hours.  60 capsule  5  . ferrous sulfate 325 (65 FE) MG tablet Take 325 mg by mouth daily.         Boris Lown Oil 500 MG CAPS Take 500 mg by mouth daily.      . metoprolol (LOPRESSOR) 50 MG tablet Take 25 mg by mouth 2 (two) times daily.        . Multiple Vitamin (MULTI-VITAMIN PO) Take by mouth daily.        . niacin 100 MG tablet Take 100 mg by mouth daily.        Marland Kitchen tiotropium (SPIRIVA) 18 MCG inhalation capsule Place 18 mcg into inhaler and inhale daily.          No Known Allergies  Review of Systems negative except from HPI and PMH  Physical Exam BP 120/78  Pulse 64  Ht 5\' 10"  (1.778 m)  Wt 177 lb 1.9 oz (80.341 kg)  BMI 25.41 kg/m2 Well developed and well nourished in no acute distress HENT normal E scleral and icterus clear Neck Supple JVP >.10  carotids brisk and full Clear to ausculation irregular rate and rhythm, no murmurs gallops or rub Soft with active bowel sounds No clubbing cyanosis 1+ Edema Alert and oriented, grossly normal motor and sensory function Skin Warm and Dry    Assessment and  Plan

## 2011-09-20 NOTE — Patient Instructions (Addendum)
Remote monitoring is used to monitor your Pacemaker of ICD from home. This monitoring reduces the number of office visits required to check your device to one time per year. It allows [place] to keep an eye on the functioning of your device to ensure it is working properly. You are scheduled for a device check from home on December 29, 2011. You may send your transmission at any time that day. If you have a wireless device, the transmission will be sent automatically. After your physician reviews your transmission, you will receive a postcard with your next transmission date.  Your physician wants you to follow-up in: 1 year with Dr Graciela Husbands.  You will receive a reminder letter in the mail two months in advance. If you don't receive a letter, please call our office to schedule the follow-up appointment.  Your physician has recommended you make the following change in your medication:  1) Start lasix (furosemide) 20 mg one tablet by mouth once daily  Your physician has requested that you have a lexiscan myoview. For further information please visit [email or web address] Please follow instruction sheet, as given.  Your physician recommends that you return for lab work the day of your stress test: bmet

## 2011-09-20 NOTE — Assessment & Plan Note (Signed)
As above.

## 2011-09-20 NOTE — Assessment & Plan Note (Signed)
This is a major issue and has been ascribed to COPD. However, is relatively rapid worsening over the last 6 months or so and its association with mild peripheral edema in the context of 78 year old grafts make me wonder as to whether this might be an anginal equivalent. We'll undertake a Myoview scan. His last ejection fraction was normal; in the event that we cannot get a gated scan which is quite likely however given the fact that he is mostly ventricularly paced, we will need to undertake an echo.  We'll begin him on Lasix 20 mg a day and check a metabolic profile when he comes back for his stress test;  review of the medical record demonstrates no recorded electrolytes or creatinine.

## 2011-09-20 NOTE — Assessment & Plan Note (Signed)
He is essentially 100% ventricularly paced. He had previous normal LV function. In the event that this is changed significantly, we'll need to consider the role of upgrade given his progressive congestive symptoms appear

## 2011-10-03 ENCOUNTER — Ambulatory Visit (HOSPITAL_COMMUNITY): Payer: Medicare Other | Attending: Cardiology | Admitting: Radiology

## 2011-10-03 ENCOUNTER — Other Ambulatory Visit (INDEPENDENT_AMBULATORY_CARE_PROVIDER_SITE_OTHER): Payer: Medicare Other

## 2011-10-03 VITALS — BP 113/76 | Ht 70.0 in | Wt 174.0 lb

## 2011-10-03 DIAGNOSIS — I4891 Unspecified atrial fibrillation: Secondary | ICD-10-CM

## 2011-10-03 DIAGNOSIS — R0989 Other specified symptoms and signs involving the circulatory and respiratory systems: Secondary | ICD-10-CM | POA: Insufficient documentation

## 2011-10-03 DIAGNOSIS — I2510 Atherosclerotic heart disease of native coronary artery without angina pectoris: Secondary | ICD-10-CM

## 2011-10-03 DIAGNOSIS — R0609 Other forms of dyspnea: Secondary | ICD-10-CM | POA: Insufficient documentation

## 2011-10-03 LAB — BASIC METABOLIC PANEL
BUN: 24 mg/dL — ABNORMAL HIGH (ref 6–23)
Creatinine, Ser: 1.1 mg/dL (ref 0.4–1.5)
GFR: 67.21 mL/min (ref >60.00)

## 2011-10-03 MED ORDER — ADENOSINE (DIAGNOSTIC) 3 MG/ML IV SOLN
0.5600 mg/kg | Freq: Once | INTRAVENOUS | Status: AC
  Administered 2011-10-03: 44.1 mg via INTRAVENOUS

## 2011-10-03 MED ORDER — TECHNETIUM TC 99M TETROFOSMIN IV KIT
33.0000 | PACK | Freq: Once | INTRAVENOUS | Status: AC | PRN
  Administered 2011-10-03: 33 via INTRAVENOUS

## 2011-10-03 MED ORDER — TECHNETIUM TC 99M TETROFOSMIN IV KIT
10.9000 | PACK | Freq: Once | INTRAVENOUS | Status: AC | PRN
  Administered 2011-10-03: 10.9 via INTRAVENOUS

## 2011-10-03 NOTE — Progress Notes (Signed)
[facility] SITE 3 NUCLEAR MED [address] [place] [address] [phone]  Cardiology Nuclear Med Study  Roy Banks is a 78 y.o. male     MRN : [id number]     DOB: 29-Sep-1930  Procedure Date: 10/03/2011  Nuclear Med Background Indication for Stress Test:  Evaluation for Ischemia and Graft Patency History:  COPD, 6/01 Heart Cath:severe LM and 2V DZ --CABGx4 EF; 65% Cardiac Risk Factors: Family History - CAD, History of Smoking, Hypertension and Lipids  Symptoms:  Chest Pain, DOE and SOB   Nuclear Pre-Procedure Caffeine/Decaff Intake:  None NPO After: 11:00pm   Lungs:  clear O2 Sat: 94% on room air. IV 0.9% NS with Angio Cath:  20g  IV Site: R Antecubital  IV Started by:  Stanton Kidney, EMT-P  Chest Size (in):  40 Cup Size: n/a  Height: 5\' 10"  (1.778 m)  Weight:  174 lb (78.926 kg)  BMI:  Body mass index is 24.97 kg/(m^2). Tech Comments:  meds were taken as directed at 8:00am, per patient.    Nuclear Med Study 1 or 2 day study: 1 day  Stress Test Type:  Adenosine  Reading MD: Marca Ancona, MD  Order Authorizing Provider:  P.Jordan/S. Graciela Husbands MD  Resting Radionuclide: Technetium 25m Tetrofosmin  Resting Radionuclide Dose: 10.9 mCi   Stress Radionuclide:  Technetium 31m Tetrofosmin  Stress Radionuclide Dose: 32.8 mCi           Stress Protocol Rest HR: 60 Stress HR: 60  Rest BP: 113/76 Stress BP: 132/83  Exercise Time (min): n/a METS: n/a   Predicted Max HR: 141 bpm % Max HR: 42.55 bpm Rate Pressure Product: 7920   Dose of Adenosine (mg):  44.3 Dose of Lexiscan: n/a mg  Dose of Atropine (mg): n/a Dose of Dobutamine: n/a mcg/kg/min (at max HR)  Stress Test Technologist: Milana Na, EMT-P  Nuclear Technologist:  Domenic Polite, CNMT     Rest Procedure:  Myocardial perfusion imaging was performed at rest 45 minutes following the intravenous administration of Technetium 59m Tetrofosmin. Rest ECG: AFIB/Pacemaker  Stress Procedure:  The  patient received IV adenosine at 140 mcg/kg/min for 4 minutes.  There were no significant changes, + sob, and flushed with infusion.  Technetium 34m Tetrofosmin was injected at the 2 minute mark and quantitative spect images were obtained after a 45 minute delay. Stress ECG: No significant change from baseline ECG  QPS Raw Data Images:  V-paced.  Stress Images:  Small, mild apical perfusion defect.  Rest Images:  Small, mild apical perfusion defect.  Subtraction (SDS):  Fixed small, mild apical perfusion defect.  Transient Ischemic Dilatation (Normal <1.22):  1.08 Lung/Heart Ratio (Normal <0.45):  0.28  Quantitative Gated Spect Images QGS EDV:  101 ml QGS ESV:  36 ml  Impression Exercise Capacity:  Adenosine study with no exercise. BP Response:  Normal blood pressure response. Clinical Symptoms:  Short of breath.  ECG Impression:  V-paced.  Comparison with Prior Nuclear Study: No significant change from previous study  Overall Impression:  Normal stress nuclear study. Small, mild fixed apical perfusion defect likely represents apical thinning.   LV Ejection Fraction: 65%.  LV Wall Motion:  NL LV Function; NL Wall Motion  [employer]

## 2011-10-13 ENCOUNTER — Encounter: Payer: Self-pay | Admitting: *Deleted

## 2011-10-14 ENCOUNTER — Telehealth: Payer: Self-pay | Admitting: *Deleted

## 2011-10-14 NOTE — Telephone Encounter (Signed)
Duke Salvia, MD More Detail >>      Duke Salvia, MD        Sent: Thu October 13, 2011  6:50 PM    To: Jefferey Pica, RN        Roy Banks    MRN: [id number] DOB: 07-15-1930     Pt Home: [phone]               Message     Normal thanks    ----- Message -----       From: Jefferey Pica, RN       Sent: 10/13/2011  12:09 PM         To: Duke Salvia, MD         Can you please review the patient's myoview. I don't see where this has been reviewed in his chart and it was done on 6/10       The patient is aware of his myoview results. Sherri Rad, RN, BSN

## 2011-12-29 ENCOUNTER — Encounter: Payer: Medicare Other | Admitting: *Deleted

## 2012-01-04 ENCOUNTER — Encounter: Payer: Self-pay | Admitting: *Deleted

## 2012-01-13 ENCOUNTER — Ambulatory Visit (INDEPENDENT_AMBULATORY_CARE_PROVIDER_SITE_OTHER): Payer: Medicare Other | Admitting: *Deleted

## 2012-01-13 ENCOUNTER — Ambulatory Visit (INDEPENDENT_AMBULATORY_CARE_PROVIDER_SITE_OTHER): Payer: Medicare Other | Admitting: Cardiology

## 2012-01-13 ENCOUNTER — Encounter: Payer: Self-pay | Admitting: Cardiology

## 2012-01-13 VITALS — BP 126/68 | HR 67 | Ht 70.0 in | Wt 180.4 lb

## 2012-01-13 DIAGNOSIS — I498 Other specified cardiac arrhythmias: Secondary | ICD-10-CM

## 2012-01-13 DIAGNOSIS — R0989 Other specified symptoms and signs involving the circulatory and respiratory systems: Secondary | ICD-10-CM

## 2012-01-13 DIAGNOSIS — I4891 Unspecified atrial fibrillation: Secondary | ICD-10-CM

## 2012-01-13 DIAGNOSIS — R001 Bradycardia, unspecified: Secondary | ICD-10-CM

## 2012-01-13 DIAGNOSIS — I2510 Atherosclerotic heart disease of native coronary artery without angina pectoris: Secondary | ICD-10-CM

## 2012-01-13 DIAGNOSIS — E785 Hyperlipidemia, unspecified: Secondary | ICD-10-CM

## 2012-01-13 LAB — CBC WITH DIFFERENTIAL/PLATELET
Basophils Absolute: 0 10*3/uL (ref 0.0–0.1)
Basophils Relative: 0.6 % (ref 0.0–3.0)
Eosinophils Absolute: 0.2 10*3/uL (ref 0.0–0.7)
Lymphocytes Relative: 28.2 % (ref 12.0–46.0)
MCHC: 33.2 g/dL (ref 30.0–36.0)
MCV: 97 fl (ref 78.0–100.0)
Monocytes Absolute: 1 10*3/uL (ref 0.1–1.0)
Neutrophils Relative %: 52.8 % (ref 43.0–77.0)
Platelets: 189 10*3/uL (ref 150.0–400.0)
RDW: 13.8 % (ref 11.5–14.6)

## 2012-01-13 LAB — HEPATIC FUNCTION PANEL
ALT: 22 U/L (ref 0–53)
AST: 28 U/L (ref 0–37)
Alkaline Phosphatase: 72 U/L (ref 39–117)
Bilirubin, Direct: 0.1 mg/dL (ref 0.0–0.3)
Total Bilirubin: 1.3 mg/dL — ABNORMAL HIGH (ref 0.3–1.2)
Total Protein: 7.4 g/dL (ref 6.0–8.3)

## 2012-01-13 LAB — BASIC METABOLIC PANEL
BUN: 17 mg/dL (ref 6–23)
CO2: 29 mEq/L (ref 19–32)
Calcium: 9.7 mg/dL (ref 8.4–10.5)
Creatinine, Ser: 1.1 mg/dL (ref 0.4–1.5)
GFR: 66.47 mL/min (ref >60.00)
Glucose, Bld: 106 mg/dL — ABNORMAL HIGH (ref 70–99)
Sodium: 136 mEq/L (ref 135–145)

## 2012-01-13 LAB — REMOTE PACEMAKER DEVICE
AL IMPEDENCE PM: 648 Ohm
BATTERY VOLTAGE: 2.96 V
BMOD-0002RV: 7
BMOD-0003RV: 30
RV LEAD IMPEDENCE PM: 672 Ohm

## 2012-01-13 LAB — LIPID PANEL: HDL: 37.1 mg/dL — ABNORMAL LOW (ref >39.00)

## 2012-01-13 LAB — LDL CHOLESTEROL, DIRECT: Direct LDL: 200.1 mg/dL

## 2012-01-13 NOTE — Progress Notes (Signed)
Roy Banks Date of Birth: 12-28-1930   History of Present Illness: [name] is seen for followup today. He reports that he is doing well. His breathing is about the same. He still has chronic shortness of breath. he's had a couple of episodes of sharp midsternal chest pain that only lasts 1-2 seconds. These are nonexertional. He is able to do his yard work. When seen by Dr. Graciela Husbands was placed on Lasix without any change in his dyspnea symptoms. He denies any palpitations. He has had no bleeding problems on Pradaxa.  Current Outpatient Prescriptions on File Prior to Visit  Medication Sig Dispense Refill  . Albuterol Sulfate (PROAIR HFA IN) Inhale into the lungs as directed.        . clonazePAM (KLONOPIN) 1 MG tablet Take half a tablet to 2 tablets in the evening.       . dabigatran (PRADAXA) 150 MG CAPS Take 1 capsule (150 mg total) by mouth every 12 (twelve) hours.  60 capsule  5  . ferrous sulfate 325 (65 FE) MG tablet Take 325 mg by mouth daily.        . furosemide (LASIX) 20 MG tablet Take 1 tablet (20 mg total) by mouth daily.  90 tablet  3  . Krill Oil 500 MG CAPS Take 500 mg by mouth daily.      . metoprolol (LOPRESSOR) 50 MG tablet Take 25 mg by mouth 2 (two) times daily.        . Multiple Vitamin (MULTI-VITAMIN PO) Take by mouth daily.        . niacin 100 MG tablet Take 100 mg by mouth daily.        Marland Kitchen tiotropium (SPIRIVA) 18 MCG inhalation capsule Place 18 mcg into inhaler and inhale daily.          No Known Allergies  Past Medical History  Diagnosis Date  . HTN (hypertension)   . Hypercholesterolemia   . Sleep apnea   . COPD (chronic obstructive pulmonary disease)   . Coronary artery disease     post CABG x4 in June, 2001  . Tachy-brady syndrome   . Atrial fibrillation     post DDD pacemaker implant  . Peripheral neuropathy     Past Surgical History  Procedure Date  . Coronary artery bypass graft 10/04/1999    x4 -- LIMA graft to LAD, saphenous vein graft to the  diagonal, saphenous vein graft to the first OM vessel, and saphenous vein graft to the distal RCA  . Cardiac catheterization 10/03/1999    Est. EF of 65% -- Severe left main and two-vessel obstructive atherosclerotic coronary  -- Diffuse ectasia of the right coronary artery -- Normal left ventricular function    History  Smoking status  . Former Smoker -- 1.0 packs/day for 32 years  . Types: Cigarettes  . Quit date: 11/26/1982  Smokeless tobacco  . Former Neurosurgeon  . Types: Chew  . Quit date: 11/26/2002  Comment: quit smoking 31 yrs ago    History  Alcohol Use No    Family History  Problem Relation Age of Onset  . Heart attack Father   . Heart attack Mother 57    and has had previous angioplasty    Review of Systems: As noted in history of present illness.  All other systems were reviewed and are negative.  Physical Exam: BP 126/68  Pulse 67  Ht 5\' 10"  (1.778 m)  Wt 180 lb 6.4 oz (81.829 kg)  BMI 25.88 kg/m2  SpO2 92% He is a pleasant white male in no acute distress. His HEENT exam is unremarkable. He has no JVD or bruits. Lungs are clear. Cardiac exam reveals a regular rate and rhythm with a grade 1-2/6 systolic murmur in the apex. Abdomen is soft and nontender without masses or bruits. Extremities are without edema. Pedal pulses are good. His neurologic exam is nonfocal.  LABORATORY DATA: ECG today demonstrates atrial fibrillation with a ventricular paced rhythm.   Assessment / Plan: 1. Coronary disease status post CABG in 2001. Patient had a recent Myoview study which showed no ischemia and normal ejection fraction. He has rare atypical symptoms of chest pain. We will continue with his current medical therapy.  2. Atrial fibrillation. He is on chronic anticoagulation. His rate control appears acceptable. He is status post pacemaker implant for bradycardia.  3. Hyperlipidemia. We will follow up on fasting lab work today  4. COPD.  5. Hypertension, controlled.

## 2012-01-13 NOTE — Patient Instructions (Signed)
Continue your current therapy  We will check lab work today and call with the results.  Call in your pacemaker check  I will see you in 6 months.

## 2012-01-17 ENCOUNTER — Telehealth: Payer: Self-pay | Admitting: Cardiology

## 2012-01-17 DIAGNOSIS — E785 Hyperlipidemia, unspecified: Secondary | ICD-10-CM

## 2012-01-17 MED ORDER — ATORVASTATIN CALCIUM 40 MG PO TABS: 40.0000 mg | ORAL_TABLET | Freq: Every day | ORAL | Status: DC

## 2012-01-17 NOTE — Telephone Encounter (Signed)
New Problem:    Patient called in wanting to tell you that he was on Pravastatin before so he would not mind going back on it, if he needs to go back on a statin.  Please call back.

## 2012-01-17 NOTE — Telephone Encounter (Signed)
F/u   plz return call to patient who said he would rather be on Pravastatin than Lipitor, he can be reached at [phone].

## 2012-01-17 NOTE — Telephone Encounter (Signed)
Patient called no answer.Left message to call back.  Dr.Jordan advised to start Lipitor 40 mg every night.Repeat Lipid and Hepatic Panels in 3 months.

## 2012-01-17 NOTE — Telephone Encounter (Signed)
Patient called was told Dr.Jordan advised to take Lipitor 40 mg every night.Repeat fasting lipids,hepatic panels 04/23/12.

## 2012-01-27 ENCOUNTER — Other Ambulatory Visit: Payer: Self-pay | Admitting: Cardiology

## 2012-01-27 MED ORDER — DABIGATRAN ETEXILATE MESYLATE 150 MG PO CAPS: 150.0000 mg | ORAL_CAPSULE | Freq: Two times a day (BID) | ORAL | Status: DC

## 2012-02-01 ENCOUNTER — Encounter: Payer: Self-pay | Admitting: *Deleted

## 2012-02-07 ENCOUNTER — Encounter: Payer: Self-pay | Admitting: Internal Medicine

## 2012-04-16 ENCOUNTER — Encounter: Payer: Self-pay | Admitting: Internal Medicine

## 2012-04-16 ENCOUNTER — Ambulatory Visit (INDEPENDENT_AMBULATORY_CARE_PROVIDER_SITE_OTHER): Payer: Medicare Other | Admitting: *Deleted

## 2012-04-16 DIAGNOSIS — R001 Bradycardia, unspecified: Secondary | ICD-10-CM

## 2012-04-16 DIAGNOSIS — I498 Other specified cardiac arrhythmias: Secondary | ICD-10-CM

## 2012-04-16 DIAGNOSIS — Z950 Presence of cardiac pacemaker: Secondary | ICD-10-CM

## 2012-04-22 LAB — REMOTE PACEMAKER DEVICE
AL IMPEDENCE PM: 664 Ohm
BATTERY VOLTAGE: 2.96 V
BMOD-0002RV: 7
BMOD-0003RV: 30
BMOD-0004RV: 5
BRDY-0002RV: 60 {beats}/min
RV LEAD IMPEDENCE PM: 640 Ohm

## 2012-04-23 ENCOUNTER — Other Ambulatory Visit (INDEPENDENT_AMBULATORY_CARE_PROVIDER_SITE_OTHER): Payer: Medicare Other

## 2012-04-23 DIAGNOSIS — E785 Hyperlipidemia, unspecified: Secondary | ICD-10-CM

## 2012-04-23 LAB — HEPATIC FUNCTION PANEL
ALT: 27 U/L (ref 0–53)
AST: 27 U/L (ref 0–37)
Alkaline Phosphatase: 72 U/L (ref 39–117)
Bilirubin, Direct: 0.1 mg/dL (ref 0.0–0.3)
Total Bilirubin: 1 mg/dL (ref 0.3–1.2)

## 2012-04-23 LAB — LIPID PANEL: Total CHOL/HDL Ratio: 3

## 2012-04-26 ENCOUNTER — Other Ambulatory Visit: Payer: Self-pay

## 2012-04-26 DIAGNOSIS — E785 Hyperlipidemia, unspecified: Secondary | ICD-10-CM

## 2012-04-26 MED ORDER — ATORVASTATIN CALCIUM 20 MG PO TABS: 20.0000 mg | ORAL_TABLET | Freq: Every day | ORAL | Status: DC

## 2012-05-01 ENCOUNTER — Encounter: Payer: Self-pay | Admitting: *Deleted

## 2012-06-06 ENCOUNTER — Telehealth: Payer: Self-pay | Admitting: Cardiology

## 2012-06-06 NOTE — Telephone Encounter (Signed)
New Problem     Pt states he feels like a buzzing/tingling feeling is occuring in his pacemaker. Would like to speak to nurse.

## 2012-06-09 ENCOUNTER — Other Ambulatory Visit: Payer: Self-pay

## 2012-06-11 NOTE — Telephone Encounter (Signed)
Spoke w/pt in regards to tingling/buzzing. Pt describes as a shock. Pt has enrhythm ppm. Pt to send [facility] to check functioning of ppm.

## 2012-06-12 NOTE — Telephone Encounter (Signed)
Spoke w/pt to let know transmission was not received. Pt did not send and will send shortly.

## 2012-06-12 NOTE — Telephone Encounter (Signed)
Transmission received. All was within normal limits. Episodes of AF which is known and pt is taking Pradaxa. Spoke w/pt and pt is aware.

## 2012-07-16 ENCOUNTER — Other Ambulatory Visit: Payer: Self-pay

## 2012-07-16 ENCOUNTER — Ambulatory Visit (INDEPENDENT_AMBULATORY_CARE_PROVIDER_SITE_OTHER): Payer: Medicare Other | Admitting: *Deleted

## 2012-07-16 ENCOUNTER — Encounter: Payer: Self-pay | Admitting: Internal Medicine

## 2012-07-16 DIAGNOSIS — Z950 Presence of cardiac pacemaker: Secondary | ICD-10-CM

## 2012-07-16 DIAGNOSIS — R001 Bradycardia, unspecified: Secondary | ICD-10-CM

## 2012-07-16 DIAGNOSIS — I498 Other specified cardiac arrhythmias: Secondary | ICD-10-CM

## 2012-07-18 ENCOUNTER — Ambulatory Visit (INDEPENDENT_AMBULATORY_CARE_PROVIDER_SITE_OTHER): Payer: Medicare Other | Admitting: Cardiology

## 2012-07-18 ENCOUNTER — Encounter: Payer: Self-pay | Admitting: Cardiology

## 2012-07-18 VITALS — BP 130/76 | HR 62 | Ht 70.0 in | Wt 182.4 lb

## 2012-07-18 DIAGNOSIS — R0989 Other specified symptoms and signs involving the circulatory and respiratory systems: Secondary | ICD-10-CM

## 2012-07-18 DIAGNOSIS — E785 Hyperlipidemia, unspecified: Secondary | ICD-10-CM

## 2012-07-18 DIAGNOSIS — I2510 Atherosclerotic heart disease of native coronary artery without angina pectoris: Secondary | ICD-10-CM

## 2012-07-18 DIAGNOSIS — I4891 Unspecified atrial fibrillation: Secondary | ICD-10-CM

## 2012-07-18 NOTE — Progress Notes (Signed)
Ladora Daniel Date of Birth: [birth year]   History of Present Illness: Mr. Pousson is seen for followup today. He reports that he is doing well. His breathing is about the same. He still has chronic shortness of breath. He denies any symptoms of chest pain or dizziness. His walking is limited because of his neuropathy and foot drop. He had a marked reduction in his cholesterol levels with the addition of atorvastatin. His dose was reduced to 20 mg per day.  Current Outpatient Prescriptions on File Prior to Visit  Medication Sig Dispense Refill  . Albuterol Sulfate (PROAIR HFA IN) Inhale into the lungs as directed.        Marland Kitchen atorvastatin (LIPITOR) 20 MG tablet Take 1 tablet (20 mg total) by mouth daily.  30 tablet  3  . clonazePAM (KLONOPIN) 1 MG tablet Take half a tablet to 2 tablets in the evening.       . dabigatran (PRADAXA) 150 MG CAPS Take 1 capsule (150 mg total) by mouth every 12 (twelve) hours.  60 capsule  5  . ferrous sulfate 325 (65 FE) MG tablet Take 325 mg by mouth daily.        . furosemide (LASIX) 20 MG tablet Take 1 tablet (20 mg total) by mouth daily.  90 tablet  3  . Krill Oil 500 MG CAPS Take 500 mg by mouth daily.      . metoprolol (LOPRESSOR) 50 MG tablet Take 25 mg by mouth 2 (two) times daily.        . Multiple Vitamin (MULTI-VITAMIN PO) Take by mouth daily.        Marland Kitchen tiotropium (SPIRIVA) 18 MCG inhalation capsule Place 18 mcg into inhaler and inhale daily.         No current facility-administered medications on file prior to visit.    No Known Allergies  Past Medical History  Diagnosis Date  . HTN (hypertension)   . Hypercholesterolemia   . Sleep apnea   . COPD (chronic obstructive pulmonary disease)   . Coronary artery disease     post CABG x4 in June, 2001  . Tachy-brady syndrome   . Atrial fibrillation     post DDD pacemaker implant  . Peripheral neuropathy     Past Surgical History  Procedure Laterality Date  . Coronary artery bypass graft   10/04/1999    x4 -- LIMA graft to LAD, saphenous vein graft to the diagonal, saphenous vein graft to the first OM vessel, and saphenous vein graft to the distal RCA  . Cardiac catheterization  10/03/1999    Est. EF of 65% -- Severe left main and two-vessel obstructive atherosclerotic coronary  -- Diffuse ectasia of the right coronary artery -- Normal left ventricular function    History  Smoking status  . Former Smoker -- 1.00 packs/day for 32 years  . Types: Cigarettes  . Quit date: 11/26/1982  Smokeless tobacco  . Former Neurosurgeon  . Types: Chew  . Quit date: 11/26/2002    Comment: quit smoking 31 yrs ago    History  Alcohol Use No    Family History  Problem Relation Age of Onset  . Heart attack Father   . Heart attack Mother 15    and has had previous angioplasty    Review of Systems: As noted in history of present illness.  All other systems were reviewed and are negative.  Physical Exam: BP 130/76  Pulse 62  Ht 5\' 10"  (1.778 m)  Wt  182 lb 6.4 oz (82.736 kg)  BMI 26.17 kg/m2  SpO2 97% He is a pleasant white male in no acute distress. His HEENT exam is unremarkable. He has no JVD or bruits. Lungs are clear. Cardiac exam reveals a regular rate and rhythm with a grade 1-2/6 systolic murmur in the apex. Abdomen is soft and nontender without masses or bruits. Extremities are without edema. Pedal pulses are good. His neurologic exam is nonfocal.  LABORATORY DATA: Lab Results  Component Value Date   WBC 6.3 01/13/2012   HGB 16.0 01/13/2012   HCT 48.1 01/13/2012   PLT 189.0 01/13/2012   GLUCOSE 106* 01/13/2012   CHOL 127 04/23/2012   TRIG 98.0 04/23/2012   HDL 43.70 04/23/2012   LDLDIRECT 200.1 01/13/2012   LDLCALC 64 04/23/2012   ALT 27 04/23/2012   AST 27 04/23/2012   NA 136 01/13/2012   K 4.4 01/13/2012   CL 100 01/13/2012   CREATININE 1.1 01/13/2012   BUN 17 01/13/2012   CO2 29 01/13/2012   TSH 3.88 01/13/2012     Assessment / Plan: 1. Coronary disease status post  CABG in 2001. Patient had a  Myoview study in June 2013 which showed no ischemia and normal ejection fraction. He has no symptoms of chest pain. We will continue with his current medical therapy.  2. Atrial fibrillation. He is on chronic anticoagulation. His rate control appears acceptable. He is status post pacemaker implant for bradycardia.  3. Hyperlipidemia. Dramatic response to atorvastatin. He is taking a trivial dose of niacin. I recommended that he stop this. He is scheduled for repeat lab work at the end of May with Dr. Jeannetta Nap.  4. COPD.  5. Hypertension, controlled.

## 2012-07-18 NOTE — Patient Instructions (Signed)
Stop taking niacin.  Continue your other therapy  I will see you again in 6 months.

## 2012-07-25 LAB — REMOTE PACEMAKER DEVICE
AL IMPEDENCE PM: 664 Ohm
BMOD-0002RV: 7
BMOD-0003RV: 30
RV LEAD IMPEDENCE PM: 672 Ohm

## 2012-08-01 ENCOUNTER — Other Ambulatory Visit: Payer: Self-pay | Admitting: *Deleted

## 2012-08-01 MED ORDER — DABIGATRAN ETEXILATE MESYLATE 150 MG PO CAPS: 150.0000 mg | ORAL_CAPSULE | Freq: Two times a day (BID) | ORAL | Status: DC

## 2012-08-01 NOTE — Telephone Encounter (Signed)
Patient left message on refill line to to refill Pradaxa 150mg , twice daily   Micki Riley, CMA

## 2012-08-15 ENCOUNTER — Encounter: Payer: Self-pay | Admitting: *Deleted

## 2012-08-23 ENCOUNTER — Ambulatory Visit (INDEPENDENT_AMBULATORY_CARE_PROVIDER_SITE_OTHER): Payer: Medicare Other | Admitting: Internal Medicine

## 2012-08-23 ENCOUNTER — Encounter: Payer: Self-pay | Admitting: Internal Medicine

## 2012-08-23 VITALS — BP 120/80 | HR 65 | Ht 70.0 in | Wt 180.0 lb

## 2012-08-23 DIAGNOSIS — I2510 Atherosclerotic heart disease of native coronary artery without angina pectoris: Secondary | ICD-10-CM

## 2012-08-23 DIAGNOSIS — I4891 Unspecified atrial fibrillation: Secondary | ICD-10-CM

## 2012-08-23 DIAGNOSIS — J849 Interstitial pulmonary disease, unspecified: Secondary | ICD-10-CM

## 2012-08-23 DIAGNOSIS — J449 Chronic obstructive pulmonary disease, unspecified: Secondary | ICD-10-CM | POA: Insufficient documentation

## 2012-08-23 DIAGNOSIS — R0609 Other forms of dyspnea: Secondary | ICD-10-CM

## 2012-08-23 DIAGNOSIS — Z950 Presence of cardiac pacemaker: Secondary | ICD-10-CM

## 2012-08-23 DIAGNOSIS — J8410 Pulmonary fibrosis, unspecified: Secondary | ICD-10-CM

## 2012-08-23 LAB — PACEMAKER DEVICE OBSERVATION
AL AMPLITUDE: 1.3 mv
AL IMPEDENCE PM: 648 Ohm
BATTERY VOLTAGE: 2.96 V
BMOD-0001RV: LOW
BMOD-0002RV: 7
RV LEAD THRESHOLD: 1 V

## 2012-08-23 NOTE — Assessment & Plan Note (Signed)
As above He also however has volume overload probably related to excessive salt intake. We'll increase his diuretic from 20 daily twice daily  alternating with daily for about a week to see if we can make some improvement here

## 2012-08-23 NOTE — Assessment & Plan Note (Signed)
Anticoagulating and tolerating Pradaxa. He does have some GI symptoms of early satiety; however, he has not lost any weight.

## 2012-08-23 NOTE — Assessment & Plan Note (Signed)
Stable without symptoms.

## 2012-08-23 NOTE — Progress Notes (Signed)
Patient Care Team: Kaleen Mask, MD as PCP - General (Family Medicine)   HPI  Roy Banks is a 79 y.o. male Seen in followup for pacer implanted 2009 because of tachybradycardia syndrome. He has now persistent-long-term atrial fibrillation and has been anticoagulated with Pradaxa  He has a history of ischemic heart disease with prior bypass surgery Last myoview 2013.undertaken for dyspnea demonstrated no ischemia and ejection fraction 65% dyspnea has been attributed in part to COPD chest x-ray 2009 raised the possibility of interstitial fibrosis.  A CT scan was recommended but never consummated he continues to have dyspnea typically when he bends over. Recently he is also noted increasing peripheral edema. His diet is not salt restricted    Past Medical History  Diagnosis Date  . HTN (hypertension)   . Hypercholesterolemia   . Sleep apnea   . COPD (chronic obstructive pulmonary disease)   . Coronary artery disease     post CABG x4 in June, 2001  . Tachy-brady syndrome   . Atrial fibrillation     post DDD pacemaker implant  . Peripheral neuropathy     Past Surgical History  Procedure Laterality Date  . Coronary artery bypass graft  10/04/1999    x4 -- LIMA graft to LAD, saphenous vein graft to the diagonal, saphenous vein graft to the first OM vessel, and saphenous vein graft to the distal RCA  . Cardiac catheterization  10/03/1999    Est. EF of 65% -- Severe left main and two-vessel obstructive atherosclerotic coronary  -- Diffuse ectasia of the right coronary artery -- Normal left ventricular function    Current Outpatient Prescriptions  Medication Sig Dispense Refill  . Albuterol Sulfate (PROAIR HFA IN) Inhale into the lungs as directed.        Marland Kitchen atorvastatin (LIPITOR) 20 MG tablet Take 1 tablet (20 mg total) by mouth daily.  30 tablet  3  . clonazePAM (KLONOPIN) 1 MG tablet 1 mg. Take one tab at night      . dabigatran (PRADAXA) 150 MG CAPS Take 1 capsule (150 mg  total) by mouth every 12 (twelve) hours.  60 capsule  5  . ferrous sulfate 325 (65 FE) MG tablet Take 325 mg by mouth daily.        . furosemide (LASIX) 20 MG tablet Take 1 tablet (20 mg total) by mouth daily.  90 tablet  3  . Krill Oil 500 MG CAPS Take 500 mg by mouth daily.      . metoprolol (LOPRESSOR) 50 MG tablet Take 25 mg by mouth 2 (two) times daily.        . Multiple Vitamin (MULTI-VITAMIN PO) Take by mouth daily.        Marland Kitchen tiotropium (SPIRIVA) 18 MCG inhalation capsule Place 18 mcg into inhaler and inhale daily.         No current facility-administered medications for this visit.    No Known Allergies  Review of Systems negative except from HPI and PMH  Physical Exam BP 120/80  Pulse 65  Ht 5\' 10"  (1.778 m)  Wt 180 lb (81.647 kg)  BMI 25.83 kg/m2 Well developed and nourished in no acute distress HENT normal Neck supple with JVP-flat Carotids brisk and full without bruits Clear Irregularly irregular rate and rhythm with controlled ventricular response, no murmurs or gallops Abd-soft with active BS without hepatomegaly No Clubbing cyanosis edema Skin-warm and dry A & Oriented  Grossly normal sensory and motor function    Assessment and  Plan

## 2012-08-23 NOTE — Patient Instructions (Addendum)
Remote monitoring is used to monitor your Pacemaker of ICD from home. This monitoring reduces the number of office visits required to check your device to one time per year. It allows [place] to keep an eye on the functioning of your device to ensure it is working properly. You are scheduled for a device check from home on 11/26/12. You may send your transmission at any time that day. If you have a wireless device, the transmission will be sent automatically. After your physician reviews your transmission, you will receive a postcard with your next transmission date.  Your physician wants you to follow-up in: 1 year with Dr Graciela Husbands. You will receive a reminder letter in the mail two months in advance. If you don't receive a letter, please call our office to schedule the follow-up appointment.  Your physician has recommended you make the following change in your medication: INCREASE Furosemide for the next 7 days - take extra around lunch every other day (3-4 days)  Non-Cardiac CT scanning, (CAT scanning), is a noninvasive, special x-ray that produces cross-sectional images of the body using x-rays and a computer. CT scans help physicians diagnose and treat medical conditions. For some CT exams, a contrast material is used to enhance visibility in the area of the body being studied. CT scans provide greater clarity and reveal more details than regular x-ray exams. (LUNG SCAN )

## 2012-08-23 NOTE — Assessment & Plan Note (Signed)
Chest x-ray 4 years ago was reviewed with the patient. CT scan was recommended for the consideration of interstitial lung disease. Given his chronic dyspnea, will undertake a CT scan and consider pulmonary referral.

## 2012-08-28 ENCOUNTER — Ambulatory Visit (INDEPENDENT_AMBULATORY_CARE_PROVIDER_SITE_OTHER)
Admission: RE | Admit: 2012-08-28 | Discharge: 2012-08-28 | Disposition: A | Discharge status: Home / Self Care | Payer: Medicare Other | Source: Ambulatory Visit | Attending: Internal Medicine | Admitting: Internal Medicine

## 2012-08-28 DIAGNOSIS — J849 Interstitial pulmonary disease, unspecified: Secondary | ICD-10-CM

## 2012-08-28 DIAGNOSIS — J8410 Pulmonary fibrosis, unspecified: Secondary | ICD-10-CM

## 2012-09-10 ENCOUNTER — Other Ambulatory Visit: Payer: Self-pay | Admitting: *Deleted

## 2012-09-10 DIAGNOSIS — J439 Emphysema, unspecified: Secondary | ICD-10-CM

## 2012-09-26 ENCOUNTER — Other Ambulatory Visit: Payer: Self-pay | Admitting: Cardiology

## 2012-10-02 ENCOUNTER — Telehealth: Payer: Self-pay | Admitting: Cardiology

## 2012-10-02 NOTE — Telephone Encounter (Signed)
New problem   Pt having problems with teeth decaying and wants to know what he should do since he's on pradaxa

## 2012-10-02 NOTE — Telephone Encounter (Signed)
He needs to see a dentist and see what needs to be done. Most dental procedures can be safely done on Pradaxa.  Suzann Lazaro [place] MD, [facility]

## 2012-10-02 NOTE — Telephone Encounter (Signed)
Returned call to patient no answer.Left message on personal voice mail will send message to Dr.Jordan for advice if he needs to hold pradaxa.

## 2012-10-03 NOTE — Telephone Encounter (Signed)
Spoke to patient he stated he has appointment with oral surgeon Dr.Joseph Hyacinth Meeker 10/30/12 for consultation.Patient stated he needs 6 teeth pulled.Dr.Jordan advised to hold pradaxa 48 hrs prior to teeth extractions.

## 2012-10-19 ENCOUNTER — Ambulatory Visit (INDEPENDENT_AMBULATORY_CARE_PROVIDER_SITE_OTHER): Payer: Medicare Other | Admitting: Pulmonary Disease

## 2012-10-19 ENCOUNTER — Encounter: Payer: Self-pay | Admitting: Pulmonary Disease

## 2012-10-19 VITALS — BP 112/76 | HR 62 | Temp 97.5°F | Ht 70.0 in | Wt 181.8 lb

## 2012-10-19 DIAGNOSIS — G2581 Restless legs syndrome: Secondary | ICD-10-CM

## 2012-10-19 DIAGNOSIS — J449 Chronic obstructive pulmonary disease, unspecified: Secondary | ICD-10-CM

## 2012-10-19 DIAGNOSIS — J4489 Other specified chronic obstructive pulmonary disease: Secondary | ICD-10-CM

## 2012-10-19 DIAGNOSIS — Q619 Cystic kidney disease, unspecified: Secondary | ICD-10-CM

## 2012-10-19 DIAGNOSIS — N281 Cyst of kidney, acquired: Secondary | ICD-10-CM | POA: Insufficient documentation

## 2012-10-19 MED ORDER — FLUTICASONE FUROATE-VILANTEROL 100-25 MCG/INH IN AEPB: 1.0000 | INHALATION_SPRAY | Freq: Every day | RESPIRATORY_TRACT | Status: DC

## 2012-10-19 MED ORDER — IPRATROPIUM BROMIDE 0.03 % NA SOLN: 1.0000 | Freq: Every day | NASAL | Status: DC

## 2012-10-19 NOTE — Assessment & Plan Note (Signed)
Breathing test Trial of Breo once every am - call me back for Rx if this works Atrovent nasal spray -1 each nare at bedtime You may qualify for a rehab program based on your lung function results

## 2012-10-19 NOTE — Progress Notes (Signed)
Subjective:    Patient ID: Roy Banks, male    DOB: Jul 31, 1930, 79 y.o.   MRN: [id number]  HPI PCP - Shelah Lewandowsky RefGraciela Husbands  79/M, Remote heavy smoker ,quit '84 referred for evaluation of COPD. He reports gradually increasing DOE x 1 yr. Dyspnea is improved by Albuterol MDI & worsened by walking & after meals. He is maintained on spiriva x 4y after 'COPD ' was diagnosed by PCP but does  Not seem like he ever had spirometry done. He smoked 1PPD starting as a teenager until he quit in 1984. He worked as an Journalist, newspaper until retirement. He reports perennial rhinitis. CT chest 08/2012 showed moderate to severe centrilobular emphysema, 5.4 x 4.3 cm low attenuation cyst  in the upper pole of the  right kidney. He reprots rt Loin pain on occasion but no hematuria. He was started on clonazepam 1 mg qhs for restless legs, which has worked well, but seems like he had rebound insomnia when he missed doses x 4ds due to late refill. He is maintained on CPAP x 41yrs for OSA & reports good compliance. He denies chest pain, orthopnea or PND Spirometry showed moderate airway obstruction with FEV1 58%, post BD 65% & FVC of 86%   Past Medical History  Diagnosis Date  . HTN (hypertension)   . Hypercholesterolemia   . Sleep apnea   . COPD (chronic obstructive pulmonary disease)   . Coronary artery disease     post CABG x4 in June, 2001  . Tachy-brady syndrome   . Atrial fibrillation     post DDD pacemaker implant  . Peripheral neuropathy     Past Surgical History  Procedure Laterality Date  . Coronary artery bypass graft  10/04/1999    x4 -- LIMA graft to LAD, saphenous vein graft to the diagonal, saphenous vein graft to the first OM vessel, and saphenous vein graft to the distal RCA  . Cardiac catheterization  10/03/1999    Est. EF of 65% -- Severe left main and two-vessel obstructive atherosclerotic coronary  -- Diffuse ectasia of the right coronary artery -- Normal left ventricular function  .  Right knee surgery      No Known Allergies  History   Social History  . Marital Status: Married    Spouse Name: N/A    Number of Children: 2  . Years of Education: N/A   Occupational History  . small Runner, broadcasting/film/video    Social History Main Topics  . Smoking status: Former Smoker -- 1.00 packs/day for 32 years    Types: Cigarettes    Quit date: 11/26/1982  . Smokeless tobacco: Former Neurosurgeon    Types: Chew    Quit date: 11/26/2002     Comment: quit smoking 31 yrs ago  . Alcohol Use: No  . Drug Use: No  . Sexually Active: Not on file   Other Topics Concern  . Not on file   Social History Narrative  . No narrative on file   Family History  Problem Relation Age of Onset  . Heart attack Father   . Heart attack Mother 86    and has had previous angioplasty     Review of Systems  Constitutional: Negative for appetite change and unexpected weight change.  HENT: Positive for dental problem. Negative for ear pain, congestion, sore throat, sneezing and trouble swallowing.   Respiratory: Positive for cough and shortness of breath.   Cardiovascular: Negative for chest pain, palpitations and leg swelling.  Gastrointestinal:  Negative for abdominal pain.  Musculoskeletal: Negative for joint swelling.  Skin: Negative for rash.  Neurological: Negative for headaches.  Psychiatric/Behavioral: Negative for dysphoric mood. The patient is not nervous/anxious.        Objective:   Physical Exam  Gen. Pleasant, well-nourished, in no distress, normal affect ENT - no lesions, no post nasal drip Neck: No JVD, no thyromegaly, no carotid bruits Lungs: no use of accessory muscles, no dullness to percussion, clear without rales or rhonchi  Cardiovascular: Rhythm regular, heart sounds  normal, no murmurs or gallops, no peripheral edema Abdomen: soft and non-tender, no hepatosplenomegaly, BS normal. Musculoskeletal: No deformities, no cyanosis or clubbing Neuro:  alert, non focal        Assessment & Plan:

## 2012-10-19 NOTE — Progress Notes (Signed)
Spirometry pre and post done today. 

## 2012-10-19 NOTE — Assessment & Plan Note (Signed)
Ultrasound of kidney for cyst on right

## 2012-10-19 NOTE — Patient Instructions (Addendum)
Breathing test Trial of Breo once every am - call me back for Rx if this works Atrovent nasal spray -1 each nare at bedtime You may qualify for a rehab program based on your lung function results If you want to come off clonazepam, trial of melatonin 5mg  1h prior to bedtime- decrease to 0.5 mg x 4 wks Ultrasound of kidney for cyst on right

## 2012-10-19 NOTE — Assessment & Plan Note (Signed)
If you want to come off clonazepam, trial of melatonin 5mg  1h prior to bedtime- decrease to 0.5 mg x 4 wks

## 2012-10-23 ENCOUNTER — Ambulatory Visit (HOSPITAL_COMMUNITY)
Admission: RE | Admit: 2012-10-23 | Discharge: 2012-10-23 | Disposition: A | Discharge status: Home / Self Care | Payer: Medicare Other | Source: Ambulatory Visit | Attending: Pulmonary Disease | Admitting: Pulmonary Disease

## 2012-10-23 DIAGNOSIS — N269 Renal sclerosis, unspecified: Secondary | ICD-10-CM | POA: Insufficient documentation

## 2012-10-23 DIAGNOSIS — N281 Cyst of kidney, acquired: Secondary | ICD-10-CM | POA: Insufficient documentation

## 2012-10-23 DIAGNOSIS — N400 Enlarged prostate without lower urinary tract symptoms: Secondary | ICD-10-CM | POA: Insufficient documentation

## 2012-10-24 ENCOUNTER — Telehealth: Payer: Self-pay | Admitting: Pulmonary Disease

## 2012-10-24 NOTE — Telephone Encounter (Signed)
I spoke with patient about results and he verbalized understanding and had no questions 

## 2012-10-30 ENCOUNTER — Other Ambulatory Visit: Payer: Self-pay | Admitting: Internal Medicine

## 2012-10-31 ENCOUNTER — Other Ambulatory Visit: Payer: Self-pay

## 2012-10-31 MED ORDER — FUROSEMIDE 20 MG PO TABS: 20.0000 mg | ORAL_TABLET | Freq: Every day | ORAL | Status: DC

## 2012-11-09 ENCOUNTER — Encounter: Payer: Self-pay | Admitting: Pulmonary Disease

## 2012-11-09 ENCOUNTER — Ambulatory Visit (INDEPENDENT_AMBULATORY_CARE_PROVIDER_SITE_OTHER): Payer: Medicare Other | Admitting: Pulmonary Disease

## 2012-11-09 VITALS — BP 112/56 | HR 63 | Temp 97.5°F | Ht 70.0 in | Wt 177.2 lb

## 2012-11-09 DIAGNOSIS — Q619 Cystic kidney disease, unspecified: Secondary | ICD-10-CM

## 2012-11-09 DIAGNOSIS — J449 Chronic obstructive pulmonary disease, unspecified: Secondary | ICD-10-CM

## 2012-11-09 DIAGNOSIS — N281 Cyst of kidney, acquired: Secondary | ICD-10-CM

## 2012-11-09 NOTE — Assessment & Plan Note (Signed)
Noted incidentally on CT chest 5/ 14 - simple cyst on [place]

## 2012-11-09 NOTE — Progress Notes (Signed)
  Subjective:    Patient ID: Roy Banks, male    DOB: [birth year], 79 y.o.   MRN: [id number]  HPI  PCP - Shelah Lewandowsky  RefGraciela Husbands   [name], remote heavy smoker ,quit '84 for FUof COPD.  He reports gradually increasing DOE x 1 yr. Dyspnea is improved by Albuterol MDI & worsened by walking & after meals.  He is maintained on spiriva x 4y after 'COPD ' was diagnosed by PCP but does Not seem like he ever had spirometry done. He smoked 1PPD starting as a teenager until he quit in 1984. He worked as an Journalist, newspaper until retirement.  He reports perennial rhinitis.  CT chest 08/2012 showed moderate to severe centrilobular emphysema He was started on clonazepam 1 mg qhs for restless legs, which has worked well, but seems like he had rebound insomnia when he missed doses x 4ds due to late refill.  He is maintained on CPAP x 52yrs for OSA & reports good compliance.Spirometry showed moderate airway obstruction with FEV1 58%, post BD 65% & FVC of 86%    11/09/2012  Pt reports breathing has unchanged. C/o cough w/ beige color phlem, wheezing at times but no chest tx. Pt stated the breo made him feel like he could take a deep breath but soon afterwards he felt like he was smothering.  Trial of melatonin >>this works , down to 0.5 of klonopin US kidney >> Simple cyst - no further imaging required enlarged prostate atrovent nasal spray seems to help    Review of Systems neg for any significant sore throat, dysphagia, itching, sneezing, nasal congestion or excess/ purulent secretions, fever, chills, sweats, unintended wt loss, pleuritic or exertional cp, hempoptysis, orthopnea pnd or change in chronic leg swelling. Also denies presyncope, palpitations, heartburn, abdominal pain, nausea, vomiting, diarrhea or change in bowel or urinary habits, dysuria,hematuria, rash, arthralgias, visual complaints, headache, numbness weakness or ataxia.     Objective:   Physical Exam  Gen. Pleasant, well-nourished, in no  distress ENT - no lesions, no post nasal drip Neck: No JVD, no thyromegaly, no carotid bruits Lungs: no use of accessory muscles, no dullness to percussion, clear without rales or rhonchi  Cardiovascular: Rhythm regular, heart sounds  normal, no murmurs or gallops, no peripheral edema Musculoskeletal: No deformities, no cyanosis or clubbing        Assessment & Plan:

## 2012-11-09 NOTE — Assessment & Plan Note (Signed)
Stay on spiriva Pulm rehab program atrovent nasal spray

## 2012-11-09 NOTE — Patient Instructions (Addendum)
Stay on spiriva Pulm rehab program

## 2012-11-26 ENCOUNTER — Ambulatory Visit (INDEPENDENT_AMBULATORY_CARE_PROVIDER_SITE_OTHER): Payer: Medicare Other | Admitting: *Deleted

## 2012-11-26 DIAGNOSIS — I498 Other specified cardiac arrhythmias: Secondary | ICD-10-CM

## 2012-11-26 DIAGNOSIS — R001 Bradycardia, unspecified: Secondary | ICD-10-CM

## 2012-11-26 DIAGNOSIS — Z950 Presence of cardiac pacemaker: Secondary | ICD-10-CM

## 2012-11-28 ENCOUNTER — Other Ambulatory Visit: Payer: Self-pay

## 2012-11-30 LAB — REMOTE PACEMAKER DEVICE
BATTERY VOLTAGE: 2.95 V
BMOD-0001RV: LOW
BMOD-0002RV: 7
BMOD-0003RV: 30

## 2012-12-25 ENCOUNTER — Encounter: Payer: Self-pay | Admitting: *Deleted

## 2012-12-26 ENCOUNTER — Telehealth (HOSPITAL_COMMUNITY): Payer: Self-pay | Admitting: *Deleted

## 2012-12-26 NOTE — Telephone Encounter (Addendum)
Referral was received for [name] for Pul Rehab.  Review of post-bronch FEV1/FVC(%) 76 does not qualify for Medicare coverage.  [name] was contacted, informed and offered Pulmonary Maintenance self pay program.  He declined.   Cathie Olden RN

## 2013-01-29 ENCOUNTER — Encounter: Payer: Self-pay | Admitting: Internal Medicine

## 2013-01-31 ENCOUNTER — Other Ambulatory Visit: Payer: Self-pay | Admitting: *Deleted

## 2013-01-31 MED ORDER — DABIGATRAN ETEXILATE MESYLATE 150 MG PO CAPS: 150.0000 mg | ORAL_CAPSULE | Freq: Two times a day (BID) | ORAL | Status: DC

## 2013-02-28 ENCOUNTER — Other Ambulatory Visit: Payer: Self-pay

## 2013-03-04 ENCOUNTER — Ambulatory Visit (INDEPENDENT_AMBULATORY_CARE_PROVIDER_SITE_OTHER): Payer: Medicare Other | Admitting: *Deleted

## 2013-03-04 DIAGNOSIS — R001 Bradycardia, unspecified: Secondary | ICD-10-CM

## 2013-03-04 DIAGNOSIS — I498 Other specified cardiac arrhythmias: Secondary | ICD-10-CM

## 2013-03-04 DIAGNOSIS — Z950 Presence of cardiac pacemaker: Secondary | ICD-10-CM

## 2013-03-04 LAB — MDC_IDC_ENUM_SESS_TYPE_REMOTE
Battery Voltage: 2.95 V
Lead Channel Impedance Value: 544 Ohm
Lead Channel Impedance Value: 640 Ohm
Lead Channel Setting Pacing Amplitude: 2.5 V
Lead Channel Setting Pacing Pulse Width: 0.4 ms
Lead Channel Setting Sensing Sensitivity: 1.5 mV
Zone Setting Detection Interval: 350 ms

## 2013-04-02 ENCOUNTER — Other Ambulatory Visit: Payer: Self-pay | Admitting: Pulmonary Disease

## 2013-04-08 ENCOUNTER — Encounter: Payer: Self-pay | Admitting: Internal Medicine

## 2013-04-15 ENCOUNTER — Telehealth: Payer: Self-pay | Admitting: Pulmonary Disease

## 2013-04-15 ENCOUNTER — Ambulatory Visit (INDEPENDENT_AMBULATORY_CARE_PROVIDER_SITE_OTHER)
Admission: RE | Admit: 2013-04-15 | Discharge: 2013-04-15 | Disposition: A | Discharge status: Home / Self Care | Payer: Medicare Other | Source: Ambulatory Visit | Attending: Internal Medicine | Admitting: Internal Medicine

## 2013-04-15 ENCOUNTER — Ambulatory Visit (INDEPENDENT_AMBULATORY_CARE_PROVIDER_SITE_OTHER): Payer: Medicare Other | Admitting: Internal Medicine

## 2013-04-15 ENCOUNTER — Other Ambulatory Visit (INDEPENDENT_AMBULATORY_CARE_PROVIDER_SITE_OTHER): Payer: Medicare Other

## 2013-04-15 ENCOUNTER — Encounter: Payer: Self-pay | Admitting: Internal Medicine

## 2013-04-15 VITALS — BP 122/66 | HR 60 | Temp 98.0°F | Ht 69.0 in | Wt 184.0 lb

## 2013-04-15 DIAGNOSIS — R0609 Other forms of dyspnea: Secondary | ICD-10-CM

## 2013-04-15 DIAGNOSIS — R0600 Dyspnea, unspecified: Secondary | ICD-10-CM

## 2013-04-15 DIAGNOSIS — J449 Chronic obstructive pulmonary disease, unspecified: Secondary | ICD-10-CM

## 2013-04-15 LAB — CBC WITH DIFFERENTIAL/PLATELET
Basophils Relative: 0.2 % (ref 0.0–3.0)
HCT: 42.5 % (ref 39.0–52.0)
Hemoglobin: 14.2 g/dL (ref 13.0–17.0)
Lymphocytes Relative: 17.3 % (ref 12.0–46.0)
Lymphs Abs: 1.4 10*3/uL (ref 0.7–4.0)
MCHC: 33.5 g/dL (ref 30.0–36.0)
Monocytes Relative: 12.1 % — ABNORMAL HIGH (ref 3.0–12.0)
Neutro Abs: 5.3 10*3/uL (ref 1.4–7.7)
Neutrophils Relative %: 65.3 % (ref 43.0–77.0)
RBC: 4.57 Mil/uL (ref 4.22–5.81)
WBC: 8.1 10*3/uL (ref 4.5–10.5)

## 2013-04-15 LAB — BASIC METABOLIC PANEL
GFR: 73.74 mL/min (ref >60.00)
Glucose, Bld: 96 mg/dL (ref 70–99)
Potassium: 5 mEq/L (ref 3.5–5.1)
Sodium: 139 mEq/L (ref 135–145)

## 2013-04-15 LAB — BRAIN NATRIURETIC PEPTIDE: Pro B Natriuretic peptide (BNP): 498 pg/mL — ABNORMAL HIGH (ref 0.0–100.0)

## 2013-04-15 MED ORDER — BUDESONIDE-FORMOTEROL FUMARATE 160-4.5 MCG/ACT IN AERO: INHALATION_SPRAY | RESPIRATORY_TRACT | Status: DC

## 2013-04-15 NOTE — Telephone Encounter (Signed)
Spoke to pt. Breathing has been doing really bad. Requests to see someone today. Has been scheduled with MW at 4pm.

## 2013-04-15 NOTE — Patient Instructions (Signed)
Symbicort 160 Take 2 puffs first thing in am and then another 2 puffs about 12 hours later and fill the prescription in two weeks if helping   Try off spriva for now  Only use your albuterol as a rescue medication to be used if you can't catch your breath by resting or doing a relaxed purse lip breathing pattern.  - The less you use it, the better it will work when you need it. - Ok to use up to 2 puffs every 4 hours if you must but call for immediate appointment if use goes up over your usual need - Don't leave home without it !!  (think of it like your spare tire for your car)   GERD (REFLUX)  is an extremely common cause of respiratory symptoms, many times with no significant heartburn at all.    It can be treated with medication, but also with lifestyle changes including avoidance of late meals, excessive alcohol, smoking cessation, and avoid fatty foods, chocolate, peppermint, colas, red wine, and acidic juices such as orange juice.  NO MINT OR MENTHOL PRODUCTS SO NO COUGH DROPS  USE SUGARLESS CANDY INSTEAD (jolley ranchers or Stover's)  NO OIL BASED VITAMINS - use powdered substitutes.  If not better, try prilosec 20mg   Take 30-60 min before first meal of the day and Pepcid 20 mg one at  bedtime x 2 week trial   Please remember to go to the lab and x-ray department downstairs for your tests - we will call you with the results when they are available.

## 2013-04-15 NOTE — Progress Notes (Signed)
Subjective:    Patient ID: Roy Banks, male    DOB: 01-11-1931.   MRN: [id number]  HPI  PCP - Roy Banks  RefGraciela Banks   [name], remote heavy smoker ,quit '84 for FUof COPD.  He reports gradually increasing DOE x 1 yr. Dyspnea is improved by Roy Banks & worsened by walking & after meals.  He is maintained on spiriva x 4y after 'COPD ' was diagnosed by PCP but does Not seem like he ever had spirometry done. He smoked 1PPD starting as a teenager until he quit in 1984. He worked as an Journalist, newspaper until retirement.  He reports perennial rhinitis.  CT chest 08/2012 showed moderate to severe centrilobular emphysema He was started on clonazepam 1 mg qhs for restless legs, which has worked well, but seems like he had rebound insomnia when he missed doses x 4ds due to late refill.  He is maintained on CPAP x 56yrs for OSA & reports good compliance.Spirometry showed moderate airway obstruction with FEV1 58%, post BD 65% & FVC of 86%    11/09/2012  Pt reports breathing has unchanged. C/o cough w/ beige color phlem, wheezing at times but no chest tx. Pt stated the breo made him feel like he could take a deep breath but soon afterwards he felt like he was smothering.  Trial of melatonin >>this works , down to 0.5 of klonopin US kidney >> Simple cyst - no further imaging required enlarged prostate atrovent nasal spray seems to help rec Stay on spiriva Pulm rehab program   04/15/2013  Acute ov/Roy Banks re: sob Chief Complaint  Patient presents with  . Acute Visit    Pt c/o increased DOE for the past wk.  He states that he gets OOB just walking from room to room at home. He states cough is prod with moderate light brown sputum. He is using Roy for rescue several times per day.   downhill x 6 months but esp x one week, coughing a lot more, coughing worse during or after eating. Worse p breo so stopped it.   No obvious day to day or daytime variabilty or assoc  cp or chest tightness, subjective  wheeze overt sinus or hb symptoms. No unusual exp hx or h/o childhood pna/ asthma or knowledge of premature birth.  Sleeping ok without nocturnal  or early am exacerbation  of respiratory  c/o's or need for noct saba. Also denies any obvious fluctuation of symptoms with weather or environmental changes or other aggravating or alleviating factors except as outlined above   Current Medications, Allergies, Complete Past Medical History, Past Surgical History, Family History, and Social History were reviewed in [employer] record.  ROS  The following are not active complaints unless bolded sore throat, dysphagia, dental problems, itching, sneezing,  nasal congestion or excess/ purulent secretions, ear ache,   fever, chills, sweats, unintended wt loss, pleuritic or exertional cp, hemoptysis,  orthopnea pnd or leg swelling, presyncope, palpitations, heartburn, abdominal pain, anorexia, nausea, vomiting, diarrhea  or change in bowel or urinary habits, change in stools or urine, dysuria,hematuria,  rash, arthralgias, visual complaints, headache, numbness weakness or ataxia or problems with walking or coordination,  change in mood/affect or memory.             Objective:   Physical Exam  Wt Readings from Last 3 Encounters:  04/15/13 184 lb (83.462 kg)  11/09/12 177 lb 3.2 oz (80.377 kg)  10/19/12 181 lb 12.8 oz (82.464 kg)  Gen. Pleasant, well-nourished, in no distress/pos voice fatigue HEENT mild turbinate edema.  Oropharynx edentulous no thrush or excess pnd or cobblestoning.  No JVD or cervical adenopathy. Mild accessory muscle hypertrophy. Trachea midline, nl thryroid. Chest was hyperinflated by percussion with diminished breath sounds and moderate increased exp time without wheeze. Hoover sign positive at mid inspiration. Regular rate and rhythm without murmur gallop or rub or increase P2 or edema.  Abd: no hsm, nl excursion. Ext warm without cyanosis or clubbing.      CXR  04/15/2013 :  Emphysema without acute cardiopulmonary findings. Stable exam.   Labs 04/15/2013 ok with BNP 498       Assessment & Plan:

## 2013-04-16 NOTE — Progress Notes (Signed)
Quick Note:  Spoke with pt and notified of results per Dr. Wert. Pt verbalized understanding and denied any questions.  ______ 

## 2013-04-16 NOTE — Progress Notes (Signed)
Quick Note:  LMTCB ______ 

## 2013-04-17 NOTE — Assessment & Plan Note (Addendum)
He is only a GOLD II with prominent cough as his major problem made worse with meals and breo   DDX of  difficult airways managment all start with A and  include Adherence, Ace Inhibitors, Acid Reflux, Active Sinus Disease, Alpha 1 Antitripsin deficiency, Anxiety masquerading as Airways dz,  ABPA,  allergy(esp in young), Aspiration (esp in elderly), Adverse effects of DPI,  Active smokers, plus two Bs  = Bronchiectasis and Beta blocker use..and one C= CHF   Adherence is always the initial "prime suspect" and is a multilayered concern that requires a "trust but verify" approach in every patient - starting with knowing how to use medications, especially inhalers, correctly, keeping up with refills and understanding the fundamental difference between maintenance and prns vs those medications only taken for a very short course and then stopped and not refilled.  The proper method of use, as well as anticipated side effects, of a metered-dose inhaler are discussed and demonstrated to the patient. Improved effectiveness after extensive coaching during this visit to a level of approximately  75% so try symbicort 160 2 bide  ? Adverse effects of dpi's > rec off all dpi's  ? Acid (or non-acid) GERD > always difficult to exclude as up to 75% of pts in some series report no assoc GI/ Heartburn symptoms> rec max (24h)  acid suppression and diet restrictions/ reviewed and instructions given in writing.   ? Anxiety > usually dx of exclusion but note already on xanax   ? chf > very unlikely with BNP < 500 and no other signs/ symptoms

## 2013-05-13 ENCOUNTER — Encounter: Payer: Self-pay | Admitting: Adult Health

## 2013-05-13 ENCOUNTER — Ambulatory Visit (INDEPENDENT_AMBULATORY_CARE_PROVIDER_SITE_OTHER): Payer: Medicare HMO | Admitting: Adult Health

## 2013-05-13 VITALS — BP 106/66 | HR 68 | Temp 97.9°F | Ht 69.0 in | Wt 178.8 lb

## 2013-05-13 DIAGNOSIS — G4733 Obstructive sleep apnea (adult) (pediatric): Secondary | ICD-10-CM

## 2013-05-13 DIAGNOSIS — J449 Chronic obstructive pulmonary disease, unspecified: Secondary | ICD-10-CM

## 2013-05-13 NOTE — Assessment & Plan Note (Signed)
Continue on CPAP At bedtime   

## 2013-05-13 NOTE — Patient Instructions (Signed)
Continue on Symbicort 2 puffs Twice daily  , rinse after use  Continue on CPAP At bedtime   follow up Dr. Elsworth Soho  In  4 months and As needed

## 2013-05-13 NOTE — Progress Notes (Signed)
  Subjective:    Patient ID: Roy Banks, male    DOB: July 27, 1930, 80 y.o.   MRN: [id number]  HPI   PCP - Roy Banks  RefCaryl Banks   [name], remote heavy smoker ,quit '84 for FUof COPD.  He reports gradually increasing DOE x 1 yr. Dyspnea is improved by Albuterol MDI & worsened by walking & after meals.  He is maintained on spiriva x 4y after 'COPD ' was diagnosed by PCP but does Not seem like he ever had spirometry done. He smoked 1PPD starting as a teenager until he quit in 1984. He worked as an Cabin crew until retirement.  He reports perennial rhinitis.  CT chest 08/2012 showed moderate to severe centrilobular emphysema He was started on clonazepam 1 mg qhs for restless legs, which has worked well, but seems like he had rebound insomnia when he missed doses x 4ds due to late refill.  He is maintained on CPAP x 43yrs for OSA & reports good compliance.Spirometry showed moderate airway obstruction with FEV1 58%, post BD 65% & FVC of 86%     11/09/12  Pt reports breathing has unchanged. C/o cough w/ beige color phlem, wheezing at times but no chest tx. Pt stated the breo made him feel like he could take a deep breath but soon afterwards he felt like he was smothering.  Trial of melatonin >>this works , down to 0.5 of klonopin US kidney >> Simple cyst - no further imaging required enlarged prostate atrovent nasal spray seems to help >>no changes   05/13/2013 Follow up  6 month follow up - reports breathing is improved since 12/22 ov w/ MW though not yet back to baseline.  thinks Symbicort is making him hoarse.  CAT = 14. Was seen last month with COPD flare, changed from spiriva to symbicort .  Feels symbicort is better and has less dypsnea.  Xray w/ no acute changes. BNP slightly elevated ~498 , no evidence of fluid overload .  On lasix daily  . Hx of atrial fib on pradaxa.  Rare use of SABA .  No ER or hospital visits.  Taking Pepcid At bedtime  , says it helps with heartburn.  Patient  denies any chest pain, orthopnea, PND, or leg swelling On CPAP At bedtime  , wears all night . Feels rested in am.     Review of Systems  neg for any significant sore throat, dysphagia, itching, sneezing, nasal congestion or excess/ purulent secretions, fever, chills, sweats, unintended wt loss, pleuritic or exertional cp, hempoptysis, orthopnea pnd or change in chronic leg swelling. Also denies presyncope, palpitations, heartburn, abdominal pain, nausea, vomiting, diarrhea or change in bowel or urinary habits, dysuria,hematuria, rash, arthralgias, visual complaints, headache, numbness weakness or ataxia.     Objective:   Physical Exam   Gen. Pleasant, elderly , in no distress ENT - no lesions, no post nasal drip Neck: No JVD, no thyromegaly, no carotid bruits Lungs: no use of accessory muscles, no dullness to percussion, diminished BS in bases  Cardiovascular: Rhythm regular, heart sounds  normal, no murmurs or gallops, no  peripheral edema Musculoskeletal: No deformities, no cyanosis or clubbing        Assessment & Plan:

## 2013-05-13 NOTE — Assessment & Plan Note (Signed)
Improved control on Symbicort.  Cont on current regimen .  

## 2013-06-07 ENCOUNTER — Ambulatory Visit (INDEPENDENT_AMBULATORY_CARE_PROVIDER_SITE_OTHER): Payer: Medicare HMO | Admitting: *Deleted

## 2013-06-07 DIAGNOSIS — I498 Other specified cardiac arrhythmias: Secondary | ICD-10-CM

## 2013-06-07 DIAGNOSIS — I4891 Unspecified atrial fibrillation: Secondary | ICD-10-CM

## 2013-06-07 DIAGNOSIS — R001 Bradycardia, unspecified: Secondary | ICD-10-CM

## 2013-06-12 LAB — MDC_IDC_ENUM_SESS_TYPE_REMOTE
Lead Channel Impedance Value: 544 Ohm
Lead Channel Sensing Intrinsic Amplitude: 14.6 mV
Lead Channel Setting Pacing Pulse Width: 0.4 ms
Lead Channel Setting Sensing Sensitivity: 1.5 mV
MDC IDC MSMT LEADCHNL RV IMPEDANCE VALUE: 656 Ohm
MDC IDC SET LEADCHNL RV PACING AMPLITUDE: 2.5 V

## 2013-07-15 ENCOUNTER — Encounter: Payer: Self-pay | Admitting: *Deleted

## 2013-07-15 ENCOUNTER — Other Ambulatory Visit: Payer: Self-pay | Admitting: Cardiology

## 2013-07-23 ENCOUNTER — Encounter: Payer: Self-pay | Admitting: Internal Medicine

## 2013-08-23 ENCOUNTER — Encounter: Payer: Self-pay | Admitting: Internal Medicine

## 2013-08-23 ENCOUNTER — Ambulatory Visit (INDEPENDENT_AMBULATORY_CARE_PROVIDER_SITE_OTHER): Payer: Medicare HMO | Admitting: Internal Medicine

## 2013-08-23 VITALS — BP 139/81 | HR 66 | Ht 69.0 in | Wt 174.0 lb

## 2013-08-23 DIAGNOSIS — I498 Other specified cardiac arrhythmias: Secondary | ICD-10-CM

## 2013-08-23 DIAGNOSIS — R001 Bradycardia, unspecified: Secondary | ICD-10-CM

## 2013-08-23 DIAGNOSIS — I4891 Unspecified atrial fibrillation: Secondary | ICD-10-CM

## 2013-08-23 DIAGNOSIS — Z950 Presence of cardiac pacemaker: Secondary | ICD-10-CM

## 2013-08-23 DIAGNOSIS — I495 Sick sinus syndrome: Secondary | ICD-10-CM

## 2013-08-23 NOTE — Progress Notes (Signed)
Patient Care Team: Leonard Downing, MD as PCP - General (Family Medicine)   HPI  Roy Banks is a 80 y.o. male Seen in followup for pacer implanted 2009 because of tachybradycardia syndrome. He has now persistent-long-term atrial fibrillation and has been anticoagulated with Pradaxa   He has a history of ischemic heart disease with prior bypass surgery Last myoview 2013.undertaken for dyspnea demonstrated no ischemia and ejection fraction 65% dyspnea has been attributed in part to COPD chest x-ray 2009 raised the possibility of interstitial fibrosis.    He continues to complain of symptoms of neuropathy in his feet. There've been no explanation for this.  He is tolerating his Pradaxa without GI symptoms. Renal function 12/14 was normal   Past Medical History  Diagnosis Date  . HTN (hypertension)   . Hypercholesterolemia   . Sleep apnea   . COPD (chronic obstructive pulmonary disease)   . Coronary artery disease     post CABG x4 in June, 2001  . Tachy-brady syndrome   . Atrial fibrillation     post DDD pacemaker implant  . Peripheral neuropathy     Past Surgical History  Procedure Laterality Date  . Coronary artery bypass graft  10/04/1999    x4 -- LIMA graft to LAD, saphenous vein graft to the diagonal, saphenous vein graft to the first OM vessel, and saphenous vein graft to the distal RCA  . Cardiac catheterization  10/03/1999    Est. EF of 65% -- Severe left main and two-vessel obstructive atherosclerotic coronary  -- Diffuse ectasia of the right coronary artery -- Normal left ventricular function  . Right knee surgery      Current Outpatient Prescriptions  Medication Sig Dispense Refill  . albuterol (PROAIR HFA) 108 (90 BASE) MCG/ACT inhaler Inhale 2 puffs into the lungs every 4 (four) hours as needed for wheezing or shortness of breath.      Marland Kitchen atorvastatin (LIPITOR) 40 MG tablet TAKE 1 TABLET (40 MG TOTAL) BY MOUTH DAILY.  30 tablet  3  .  budesonide-formoterol (SYMBICORT) 160-4.5 MCG/ACT inhaler Take 2 puffs first thing in am and then another 2 puffs about 12 hours later.  1 Inhaler  11  . clonazePAM (KLONOPIN) 1 MG tablet 1/2 tab at bedtime      . dabigatran (PRADAXA) 150 MG CAPS capsule Take 1 capsule (150 mg total) by mouth every 12 (twelve) hours.  60 capsule  8  . ferrous sulfate 325 (65 FE) MG tablet Take 325 mg by mouth daily.        . furosemide (LASIX) 20 MG tablet TAKE 1 TABLET (20 MG TOTAL) BY MOUTH DAILY.  90 tablet  3  . ipratropium (ATROVENT) 0.03 % nasal spray 1 spray as needed      . Melatonin 5 MG TABS Take by mouth at bedtime.      . metoprolol (LOPRESSOR) 50 MG tablet Take 25 mg by mouth 2 (two) times daily.        . Multiple Vitamin (MULTI-VITAMIN PO) Take by mouth daily.         No current facility-administered medications for this visit.    No Known Allergies  Review of Systems negative except from HPI and PMH  Physical Exam BP 139/81  Pulse 66  Ht 5\' 9"  (1.753 m)  Wt 174 lb (78.926 kg)  BMI 25.68 kg/m2 Well developed and well nourished in no acute distress HENT normal E scleral and icterus clear Neck Supple JVP  flat; carotids brisk and full Clear to ausculation Device pocket well healed; without hematoma or erythema.  There is no tethering Regular rate and rhythm, no murmurs gallops or rub Soft with active bowel sounds No clubbing cyanosis  Edema Alert and oriented, grossly normal motor and sensory function Skin Warm and Dry  ECG demonstrates atrial fibrillation with ventricular pacing   Assessment and  Plan   atrial fibrillation-permanent  Pacemaker-[employer]   The patient's device was interrogated.  The information was reviewed. No changes were made in the programming.    Bradycardia  Ischemic heart disease with prior CABG and normal LV function  Neuropathy  Rhythm with patient is stable I wonder whether any of his medications could be contributing to his neuropathy we will  plan to have him hold his Lipitor for a month and his metoprolol from 1 thereafter. We'll arrange follow up with Dr. Martinique in about 2 months time.

## 2013-08-23 NOTE — Patient Instructions (Addendum)
Your physician recommends that you continue on your current medications as directed. Please refer to the Current Medication list given to you today.  Your physician recommends that you schedule a follow-up appointment in: 2 months with Dr. Martinique.  Remote monitoring is used to monitor your Pacemaker of ICD from home. This monitoring reduces the number of office visits required to check your device to one time per year. It allows [place] to keep an eye on the functioning of your device to ensure it is working properly. You are scheduled for a device check from home on 11/25/13. You may send your transmission at any time that day. If you have a wireless device, the transmission will be sent automatically. After your physician reviews your transmission, you will receive a postcard with your next transmission date.  Your physician wants you to follow-up in: 1 year with Dr. Caryl Comes.  You will receive a reminder letter in the mail two months in advance. If you don't receive a letter, please call our office to schedule the follow-up appointment.

## 2013-08-28 ENCOUNTER — Encounter: Payer: Self-pay | Admitting: Internal Medicine

## 2013-10-02 ENCOUNTER — Encounter: Payer: Self-pay | Admitting: Pulmonary Disease

## 2013-10-02 ENCOUNTER — Ambulatory Visit (INDEPENDENT_AMBULATORY_CARE_PROVIDER_SITE_OTHER): Payer: Medicare HMO | Admitting: Pulmonary Disease

## 2013-10-02 VITALS — BP 118/64 | HR 64 | Temp 98.0°F | Ht 69.0 in | Wt 168.4 lb

## 2013-10-02 DIAGNOSIS — G4733 Obstructive sleep apnea (adult) (pediatric): Secondary | ICD-10-CM

## 2013-10-02 DIAGNOSIS — J449 Chronic obstructive pulmonary disease, unspecified: Secondary | ICD-10-CM

## 2013-10-02 NOTE — Assessment & Plan Note (Signed)
Ct clonazepam for restless legs Advised against medications with sedative side effects Cautioned against driving when sleepy - understanding that sleepiness will vary on a day to day basis

## 2013-10-02 NOTE — Patient Instructions (Addendum)
Decrease prednisone to 1/2 tab daily until done, then STOP Stay on symbicort twice daily Take mucinex if needed for cough Call if worse

## 2013-10-02 NOTE — Assessment & Plan Note (Signed)
Recent flare Decrease prednisone to 1/2 tab daily until done, then STOP Stay on symbicort twice daily Take mucinex if needed for cough Call if worse

## 2013-10-02 NOTE — Progress Notes (Signed)
   Subjective:    Patient ID: Roy Banks, male    DOB: 1930/10/04, 80 y.o.   MRN: [id number]  HPI  PCP - Welton Flakes  Ref- Caryl Comes   83/M, remote heavy smoker ,quit '84 for FU of COPD.   Spirometry  2014 -FEV1 58%, post BD 65% & FVC of 86%   He smoked 1PPD starting as a teenager until he quit in 1984. He worked as an Cabin crew until retirement.  He reports perennial rhinitis.  CT chest 08/2012 showed moderate to severe centrilobular emphysema  He is maintained on CPAP x 60yrs for OSA & reports good compliance.   Meds - He was started on clonazepam 1 mg qhs for restless legs, which has worked well, but seems like he had rebound insomnia when he missed doses  breo made him feel like he could take a deep breath but soon afterwards he felt like he was smothering. Tolerated symbicort Trial of melatonin >>helped , down to 0.5 of klonopin     10/02/2013  Chief Complaint  Patient presents with  . Follow-up    Breathing is slightly better. c/o occas prod cough a times. Occas wheezing.chest tx. Pt wears CPAP everynight x 6-9 hrs a night.   66m FU -  developed a hard cough-pred given for COPD flare by PCP x 1 month- is about to pick up refill, breathing better, cough decreased Down to 0.5 mg clonazepam Allergies ok  On lasix daily . Hx of atrial fib on pradaxa.  Rare use of SABA .  No ER or hospital visits.  Taking Pepcid At bedtime , says it helps with heartburn.  Patient denies any chest pain, orthopnea, PND, or leg swelling  On CPAP At bedtime , wears all night . Feels rested in am.   Remains on daily 5 mg of prednisone for unclear reason     Review of Systems neg for any significant sore throat, dysphagia, itching, sneezing, nasal congestion or excess/ purulent secretions, fever, chills, sweats, unintended wt loss, pleuritic or exertional cp, hempoptysis, orthopnea pnd or change in chronic leg swelling. Also denies presyncope, palpitations, heartburn, abdominal pain, nausea,  vomiting, diarrhea or change in bowel or urinary habits, dysuria,hematuria, rash, arthralgias, visual complaints, headache, numbness weakness or ataxia.     Objective:   Physical Exam  Gen. Pleasant, well-nourished, in no distress ENT - no lesions, no post nasal drip Neck: No JVD, no thyromegaly, no carotid bruits Lungs: no use of accessory muscles, no dullness to percussion, clear without rales or rhonchi  Cardiovascular: Rhythm regular, heart sounds  normal, no murmurs or gallops, no peripheral edema Musculoskeletal: No deformities, no cyanosis or clubbing        Assessment & Plan:

## 2013-10-29 ENCOUNTER — Other Ambulatory Visit: Payer: Self-pay | Admitting: Pulmonary Disease

## 2013-11-01 ENCOUNTER — Encounter: Payer: Self-pay | Admitting: Cardiology

## 2013-11-01 ENCOUNTER — Ambulatory Visit (INDEPENDENT_AMBULATORY_CARE_PROVIDER_SITE_OTHER): Payer: Medicare HMO | Admitting: Cardiology

## 2013-11-01 VITALS — BP 137/79 | HR 81 | Ht 68.5 in | Wt 171.0 lb

## 2013-11-01 DIAGNOSIS — J449 Chronic obstructive pulmonary disease, unspecified: Secondary | ICD-10-CM

## 2013-11-01 DIAGNOSIS — I2510 Atherosclerotic heart disease of native coronary artery without angina pectoris: Secondary | ICD-10-CM

## 2013-11-01 DIAGNOSIS — I482 Chronic atrial fibrillation, unspecified: Secondary | ICD-10-CM

## 2013-11-01 DIAGNOSIS — Z950 Presence of cardiac pacemaker: Secondary | ICD-10-CM

## 2013-11-01 DIAGNOSIS — E785 Hyperlipidemia, unspecified: Secondary | ICD-10-CM

## 2013-11-01 DIAGNOSIS — I4891 Unspecified atrial fibrillation: Secondary | ICD-10-CM

## 2013-11-01 NOTE — Patient Instructions (Signed)
Avoid salt  Continue your current medication  I will see you in one year.

## 2013-11-01 NOTE — Progress Notes (Signed)
[name] Date of Birth: 27-Feb-1931   History of Present Illness: Roy Banks is seen for followup today. He has a history of CAD and is s/p CABG in 2001. He had a normal Myoview in June 2013. He reports that he is doing well.  He still has chronic shortness of breath related to COPD. He is very active doing yard work. He denies any symptoms of chest pain or dizziness. His walking is limited because of his neuropathy and foot drop. He reports recent lab work with Dr. Arelia Sneddon was excellent. He has a history of atrial fibrillation and tachy-brady syndrome. He is s/p pacemaker implant in 2009. He is on Pradaxa.  Current Outpatient Prescriptions on File Prior to Visit  Medication Sig Dispense Refill  . albuterol (PROAIR HFA) 108 (90 BASE) MCG/ACT inhaler Inhale 2 puffs into the lungs every 4 (four) hours as needed for wheezing or shortness of breath.      Marland Kitchen atorvastatin (LIPITOR) 40 MG tablet TAKE 1 TABLET (40 MG TOTAL) BY MOUTH DAILY.  30 tablet  3  . budesonide-formoterol (SYMBICORT) 160-4.5 MCG/ACT inhaler Take 2 puffs first thing in am and then another 2 puffs about 12 hours later.  1 Inhaler  11  . clonazePAM (KLONOPIN) 1 MG tablet 1/2 tab at bedtime      . dabigatran (PRADAXA) 150 MG CAPS capsule Take 1 capsule (150 mg total) by mouth every 12 (twelve) hours.  60 capsule  8  . ferrous sulfate 325 (65 FE) MG tablet Take 325 mg by mouth daily.        . furosemide (LASIX) 20 MG tablet TAKE 1 TABLET (20 MG TOTAL) BY MOUTH DAILY.  90 tablet  3  . ipratropium (ATROVENT) 0.03 % nasal spray 1 spray as needed      . ipratropium (ATROVENT) 0.03 % nasal spray PLACE 1 SPRAY INTO THE NOSE AT BEDTIME.  30 mL  1  . Melatonin 5 MG TABS Take by mouth at bedtime.      . Multiple Vitamin (MULTI-VITAMIN PO) Take by mouth daily.        . metoprolol (LOPRESSOR) 50 MG tablet Take 25 mg by mouth 2 (two) times daily.         No current facility-administered medications on file prior to visit.    No Known  Allergies  Past Medical History  Diagnosis Date  . HTN (hypertension)   . Hypercholesterolemia   . Sleep apnea   . COPD (chronic obstructive pulmonary disease)   . Coronary artery disease     post CABG x4 in June, 2001  . Tachy-brady syndrome   . Atrial fibrillation     post DDD pacemaker implant  . Peripheral neuropathy     Past Surgical History  Procedure Laterality Date  . Coronary artery bypass graft  10/04/1999    x4 -- LIMA graft to LAD, saphenous vein graft to the diagonal, saphenous vein graft to the first OM vessel, and saphenous vein graft to the distal RCA  . Cardiac catheterization  10/03/1999    Est. EF of 65% -- Severe left main and two-vessel obstructive atherosclerotic coronary  -- Diffuse ectasia of the right coronary artery -- Normal left ventricular function  . Right knee surgery      History  Smoking status  . Former Smoker -- 1.00 packs/day for 32 years  . Types: Cigarettes  . Quit date: 11/26/1982  Smokeless tobacco  . Former Systems developer  . Types: Chew  . Quit  date: 11/26/2002    Comment: quit smoking 31 yrs ago    History  Alcohol Use No    Family History  Problem Relation Age of Onset  . Heart attack Father   . Heart attack Mother 68    and has had previous angioplasty    Review of Systems: As noted in history of present illness.  He does have occasional swelling. He eats a lot of salt. All other systems were reviewed and are negative.  Physical Exam: BP 137/79  Pulse 81  Ht 5' 8.5" (1.74 m)  Wt 171 lb (77.565 kg)  BMI 25.62 kg/m2 He is a pleasant white male in no acute distress. His HEENT exam is unremarkable. He has no JVD or bruits. Lungs are clear. Cardiac exam reveals a regular rate and rhythm with a grade 2-3/7 systolic murmur in the apex. Abdomen is soft and nontender without masses or bruits. Extremities are with trace edema. Pedal pulses are good. His neurologic exam is nonfocal.  LABORATORY DATA: Lab Results  Component Value Date    WBC 8.1 04/15/2013   HGB 14.2 04/15/2013   HCT 42.5 04/15/2013   PLT 244.0 04/15/2013   GLUCOSE 96 04/15/2013   CHOL 127 04/23/2012   TRIG 98.0 04/23/2012   HDL 43.70 04/23/2012   LDLDIRECT 200.1 01/13/2012   LDLCALC 64 04/23/2012   ALT 27 04/23/2012   AST 27 04/23/2012   NA 139 04/15/2013   K 5.0 04/15/2013   CL 103 04/15/2013   CREATININE 1.0 04/15/2013   BUN 20 04/15/2013   CO2 30 04/15/2013   TSH 4.67 04/15/2013     Assessment / Plan: 1. Coronary disease status post CABG in 2001. Patient had a  Myoview study in June 2013 which showed no ischemia and normal ejection fraction. He has no symptoms of chest pain. We will continue with his current medical therapy.  2. Atrial fibrillation. He is on chronic anticoagulation. His rate control appears acceptable. He is status post pacemaker implant for bradycardia.  3. Hyperlipidemia. On statin.  4. COPD.  5. Hypertension, controlled.  6. Edema. Recommend restriction of salt intake.

## 2013-11-02 ENCOUNTER — Other Ambulatory Visit: Payer: Self-pay | Admitting: Internal Medicine

## 2013-11-15 ENCOUNTER — Other Ambulatory Visit: Payer: Self-pay

## 2013-11-15 MED ORDER — ATORVASTATIN CALCIUM 40 MG PO TABS: ORAL_TABLET | ORAL | Status: DC

## 2013-11-25 ENCOUNTER — Ambulatory Visit (INDEPENDENT_AMBULATORY_CARE_PROVIDER_SITE_OTHER): Payer: Medicare HMO | Admitting: *Deleted

## 2013-11-25 DIAGNOSIS — I4891 Unspecified atrial fibrillation: Secondary | ICD-10-CM

## 2013-11-25 DIAGNOSIS — I482 Chronic atrial fibrillation, unspecified: Secondary | ICD-10-CM

## 2013-11-25 LAB — MDC_IDC_ENUM_SESS_TYPE_REMOTE
Lead Channel Impedance Value: 488 Ohm
Lead Channel Impedance Value: 488 Ohm
Lead Channel Impedance Value: 592 Ohm
Lead Channel Sensing Intrinsic Amplitude: 1.5 mV
Lead Channel Setting Pacing Amplitude: 2.5 V
Lead Channel Setting Pacing Pulse Width: 0.4 ms
Lead Channel Setting Sensing Sensitivity: 1.5 mV
MDC IDC MSMT BATTERY VOLTAGE: 2.89 V
MDC IDC SESS DTM: 20150803132542
MDC IDC STAT BRADY RV PERCENT PACED: 97.96 %
Zone Setting Detection Interval: 350 ms
Zone Setting Detection Interval: 400 ms

## 2013-11-27 NOTE — Progress Notes (Signed)
Remote pacemaker transmission.   

## 2013-12-17 ENCOUNTER — Encounter: Payer: Self-pay | Admitting: Cardiology

## 2013-12-19 ENCOUNTER — Encounter: Payer: Self-pay | Admitting: Cardiology

## 2013-12-26 ENCOUNTER — Encounter: Payer: Self-pay | Admitting: Internal Medicine

## 2014-01-09 ENCOUNTER — Telehealth: Payer: Self-pay | Admitting: Internal Medicine

## 2014-01-09 DIAGNOSIS — G4733 Obstructive sleep apnea (adult) (pediatric): Secondary | ICD-10-CM

## 2014-01-09 NOTE — Telephone Encounter (Signed)
Pt saw RA 10/02/13. Called spoke with pt. He reports he has had current CPAP machine x 70-78 years old. He is wanting a new machine.  Please advise RA thanks

## 2014-01-09 NOTE — Telephone Encounter (Signed)
Called carol [phone]. She reports pt is set at 5 cm per carol's notes. Please advise thanks

## 2014-01-09 NOTE — Telephone Encounter (Signed)
Pl get settings from [facility] & I can write rx

## 2014-01-09 NOTE — Telephone Encounter (Signed)
Arbie Cookey called back. She reports in 2009 we did auto and pressure was changed to 7 cm. This all they have

## 2014-01-10 NOTE — Telephone Encounter (Signed)
Rx for CPAP 7 cm can be sent

## 2014-01-10 NOTE — Telephone Encounter (Signed)
Order has been placed to [facility] for new cpap machine.

## 2014-02-04 ENCOUNTER — Telehealth: Payer: Self-pay | Admitting: Pulmonary Disease

## 2014-02-04 DIAGNOSIS — J449 Chronic obstructive pulmonary disease, unspecified: Secondary | ICD-10-CM

## 2014-02-04 DIAGNOSIS — G4733 Obstructive sleep apnea (adult) (pediatric): Secondary | ICD-10-CM

## 2014-02-04 NOTE — Telephone Encounter (Signed)
Called spoke with Arbie Cookey from [facility]. She reports she sent over that pt needs secondary diagnosis code d/t pt AHI is not high enough. Per pt insurance they are requiring this. She reports since pt does have COPD we can add this to order as well. I have done so. Once they receive this, they will call pt to set CPAP up. I have re placed order   Called pt and LMTCB x1

## 2014-02-05 NOTE — Telephone Encounter (Signed)
Spoke with pt and informed that new order was placed and he will be contacted by [facility] for new set up.  Pt verbalized understanding.

## 2014-02-17 ENCOUNTER — Other Ambulatory Visit: Payer: Self-pay | Admitting: Internal Medicine

## 2014-02-22 ENCOUNTER — Other Ambulatory Visit: Payer: Self-pay

## 2014-02-22 MED ORDER — ATORVASTATIN CALCIUM 40 MG PO TABS: ORAL_TABLET | ORAL | Status: DC

## 2014-02-26 ENCOUNTER — Ambulatory Visit (INDEPENDENT_AMBULATORY_CARE_PROVIDER_SITE_OTHER): Payer: Medicare HMO | Admitting: *Deleted

## 2014-02-26 DIAGNOSIS — I495 Sick sinus syndrome: Secondary | ICD-10-CM

## 2014-02-26 LAB — MDC_IDC_ENUM_SESS_TYPE_REMOTE
Battery Voltage: 2.9 V
Lead Channel Setting Pacing Amplitude: 2.5 V
Lead Channel Setting Pacing Pulse Width: 0.4 ms
MDC IDC MSMT LEADCHNL RA IMPEDANCE VALUE: 520 Ohm
MDC IDC MSMT LEADCHNL RV IMPEDANCE VALUE: 616 Ohm
MDC IDC SET LEADCHNL RV SENSING SENSITIVITY: 1.5 mV
MDC IDC STAT BRADY RV PERCENT PACED: 99.8 %
Zone Setting Detection Interval: 350 ms
Zone Setting Detection Interval: 400 ms

## 2014-02-26 NOTE — Progress Notes (Signed)
Remote pacemaker transmission.   

## 2014-03-05 ENCOUNTER — Telehealth: Payer: Self-pay | Admitting: Pulmonary Disease

## 2014-03-05 NOTE — Telephone Encounter (Signed)
lmomtcb x1 

## 2014-03-06 ENCOUNTER — Telehealth: Payer: Self-pay | Admitting: Pulmonary Disease

## 2014-03-06 NOTE — Telephone Encounter (Signed)
lmomtcb x 2  

## 2014-03-06 NOTE — Telephone Encounter (Signed)
Called and spoke to an [facility] rep. Informed the rep of the new secondary diagnosis and the notes we faxed. The rep stated she has taken the order off hold and the order is now being processed for pt to received the new CPAP. Called and spoke to pt and informed him of the update. Pt verbalized understanding and denied any further questions or concerns at this time.

## 2014-03-06 NOTE — Telephone Encounter (Signed)
Please see phone note from 03/05/14.

## 2014-03-06 NOTE — Telephone Encounter (Signed)
Called and spoke to pt. Pt stated he received a call from [facility] and was informed that they cannot give him his CPAP without more info. Informed pt that we will call [facility] and call him back. Called and spoke to [place] and was informed the pt will need a secondary diagnosis associated with this order--per phone note on 10/13 this was already handled. Informed Lela of the update from 10/13 and Lela stated she will speak with her supervisor to assure a secondary diagnosis of COPD will suffice. Lela stated she will call back once she speaks with her supervisor. Will await call.

## 2014-03-06 NOTE — Telephone Encounter (Signed)
Spoke with Lattie Haw at [facility] and she states per [place] that COPD is not an approved secondary diagnosis.  She states that they will accept hypertension or ischemic heart disease.  Faxed a ov note from Dr Peter Martinique stating that pt has past history of hypertension to [facility] at [phone].

## 2014-03-13 ENCOUNTER — Encounter: Payer: Self-pay | Admitting: Cardiology

## 2014-03-19 ENCOUNTER — Encounter: Payer: Self-pay | Admitting: Internal Medicine

## 2014-03-28 ENCOUNTER — Encounter: Payer: Self-pay | Admitting: Cardiology

## 2014-04-03 ENCOUNTER — Ambulatory Visit (INDEPENDENT_AMBULATORY_CARE_PROVIDER_SITE_OTHER): Payer: Medicare HMO | Admitting: Adult Health

## 2014-04-03 ENCOUNTER — Encounter: Payer: Self-pay | Admitting: Adult Health

## 2014-04-03 ENCOUNTER — Ambulatory Visit (INDEPENDENT_AMBULATORY_CARE_PROVIDER_SITE_OTHER)
Admission: RE | Admit: 2014-04-03 | Discharge: 2014-04-03 | Disposition: A | Discharge status: Home / Self Care | Payer: Medicare HMO | Source: Ambulatory Visit | Attending: Adult Health | Admitting: Adult Health

## 2014-04-03 VITALS — BP 126/74 | HR 65 | Temp 97.3°F | Ht 68.5 in | Wt 167.0 lb

## 2014-04-03 DIAGNOSIS — J449 Chronic obstructive pulmonary disease, unspecified: Secondary | ICD-10-CM

## 2014-04-03 DIAGNOSIS — Z23 Encounter for immunization: Secondary | ICD-10-CM

## 2014-04-03 DIAGNOSIS — G4733 Obstructive sleep apnea (adult) (pediatric): Secondary | ICD-10-CM

## 2014-04-03 MED ORDER — PREDNISONE 10 MG PO TABS: ORAL_TABLET | ORAL | Status: DC

## 2014-04-03 MED ORDER — TIOTROPIUM BROMIDE MONOHYDRATE 18 MCG IN CAPS: 18.0000 ug | ORAL_CAPSULE | Freq: Every day | RESPIRATORY_TRACT | Status: DC

## 2014-04-03 NOTE — Patient Instructions (Addendum)
Prednisone taper over the next week. Mucinex DM Twice daily  As needed  Cough/congestion  Chest x-ray today Flu shot today Add Spiriva 1 puff daily Continue on Symbicort 2 puffs twice daily, rinse after use. Continue on C Pap at bedtime Follow-up with Dr. Elsworth Soho in 6 months and as needed

## 2014-04-03 NOTE — Addendum Note (Signed)
Addended by: Parke Poisson E on: 04/03/2014 05:07 PM   Modules accepted: Orders

## 2014-04-03 NOTE — Assessment & Plan Note (Signed)
Compensated on nocturnal C Pap-good compliance Patient education on not driving his sleepy Wear at least 6 hours each night

## 2014-04-03 NOTE — Progress Notes (Signed)
   Subjective:    Patient ID: Roy Banks, male    DOB: 11-20-1930, 80 y.o.   MRN: [id number]  HPI  PCP - Welton Flakes  Ref- Caryl Comes   83/M, remote heavy smoker ,quit '84 for FU of COPD.   Spirometry  2014 -FEV1 58%, post BD 65% & FVC of 86%   He smoked 1PPD starting as a teenager until he quit in 1984. He worked as an Cabin crew until retirement.  He reports perennial rhinitis.  CT chest 08/2012 showed moderate to severe centrilobular emphysema  He is maintained on CPAP x 38yrs for OSA & reports good compliance.   Meds - He was started on clonazepam 1 mg qhs for restless legs, which has worked well, but seems like he had rebound insomnia when he missed doses  breo made him feel like he could take a deep breath but soon afterwards he felt like he was smothering. Tolerated symbicort Trial of melatonin >>helped , down to 0.5 of klonopin     10/02/2013  Chief Complaint  Patient presents with  . Follow-up    Breathing is slightly better. c/o occas prod cough a times. Occas wheezing.chest tx. Pt wears CPAP everynight x 6-9 hrs a night.   56m FU -  developed a hard cough-pred given for COPD flare by PCP x 1 month- is about to pick up refill, breathing better, cough decreased Down to 0.5 mg clonazepam Allergies ok  On lasix daily . Hx of atrial fib on pradaxa.  Rare use of SABA .  No ER or hospital visits.  Taking Pepcid At bedtime , says it helps with heartburn.  Patient denies any chest pain, orthopnea, PND, or leg swelling  On CPAP At bedtime , wears all night . Feels rested in am.   Remains on daily 5 mg of prednisone for unclear reason   04/03/2014 Follow up COPD and OSA /CPAP  Returns for 6 months follow up for COPD and OSA . Reports some increased SOB, wheezing, tightness, prod cough with yellow/white  mucus x2 months.  Feels more DOE over last couple of months.  Denies any f/c/s, n/v/d, hemoptysis, PND, leg swelling  Wears C Pap every night for at least 6 hours. Says that  he sleeps very well and feels rested in the morning. He denies any daytime sleepiness. Says he's recently got new mask supplies and are doing well  Review of Systems neg for any significant sore throat, dysphagia, itching, sneezing, nasal congestion or excess/ purulent secretions, fever, chills, sweats, unintended wt loss, pleuritic or exertional cp, hempoptysis, orthopnea pnd or change in chronic leg swelling. Also denies presyncope, palpitations, heartburn, abdominal pain, nausea, vomiting, diarrhea or change in bowel or urinary habits, dysuria,hematuria, rash, arthralgias, visual complaints, headache, numbness weakness or ataxia.     Objective:   Physical Exam  Gen. Pleasant, well-nourished, in no distress, elderly , walks with cane (has light on it)  ENT - no lesions, no post nasal drip Neck: No JVD, no thyromegaly, no carotid bruits Lungs: no use of accessory muscles, no dullness to percussion, decreased BS in bases  Cardiovascular: Rhythm regular, heart sounds  normal, no murmurs or gallops, tr  peripheral edema Musculoskeletal: No deformities, no cyanosis or clubbing        Assessment & Plan:

## 2014-04-03 NOTE — Addendum Note (Signed)
Addended by: Devona Konig on: 04/03/2014 02:54 PM   Modules accepted: Orders

## 2014-04-03 NOTE — Addendum Note (Signed)
Addended by: Melvenia Needles on: 04/03/2014 03:43 PM   Modules accepted: Orders

## 2014-04-03 NOTE — Assessment & Plan Note (Addendum)
COPD with mild flare Chest x-ray today  Plan  Prednisone taper over the next week. Add Spiriva daily Flu  Shot today Follow-up in 6 months and as needed

## 2014-04-07 NOTE — Progress Notes (Signed)
Reviewed & agree with plan  

## 2014-04-10 NOTE — Progress Notes (Signed)
Quick Note:  Called spoke with patient, advised of cxr results / recs as stated by TP. Pt verbalized his understanding and denied any questions. ______ 

## 2014-05-06 ENCOUNTER — Other Ambulatory Visit: Payer: Self-pay | Admitting: Internal Medicine

## 2014-05-09 ENCOUNTER — Other Ambulatory Visit: Payer: Self-pay

## 2014-05-09 MED ORDER — DABIGATRAN ETEXILATE MESYLATE 150 MG PO CAPS: ORAL_CAPSULE | ORAL | Status: DC

## 2014-05-29 ENCOUNTER — Ambulatory Visit (INDEPENDENT_AMBULATORY_CARE_PROVIDER_SITE_OTHER): Payer: Medicare HMO | Admitting: *Deleted

## 2014-05-29 DIAGNOSIS — I495 Sick sinus syndrome: Secondary | ICD-10-CM

## 2014-05-29 LAB — MDC_IDC_ENUM_SESS_TYPE_REMOTE
Brady Statistic RV Percent Paced: 99.89 %
Date Time Interrogation Session: 20160204174648
Lead Channel Sensing Intrinsic Amplitude: 15.4368
Lead Channel Setting Pacing Amplitude: 2.5 V
MDC IDC MSMT BATTERY VOLTAGE: 2.86 V
MDC IDC MSMT LEADCHNL RA IMPEDANCE VALUE: 472 Ohm
MDC IDC MSMT LEADCHNL RV IMPEDANCE VALUE: 592 Ohm
MDC IDC SET LEADCHNL RV PACING PULSEWIDTH: 0.4 ms
MDC IDC SET LEADCHNL RV SENSING SENSITIVITY: 1.5 mV
Zone Setting Detection Interval: 350 ms
Zone Setting Detection Interval: 400 ms

## 2014-05-29 NOTE — Progress Notes (Signed)
Remote pacemaker transmission.   

## 2014-05-30 ENCOUNTER — Encounter: Payer: Self-pay | Admitting: Internal Medicine

## 2014-06-19 ENCOUNTER — Telehealth: Payer: Self-pay | Admitting: Pulmonary Disease

## 2014-06-19 MED ORDER — IPRATROPIUM BROMIDE 0.03 % NA SOLN: 1.0000 | Freq: Every day | NASAL | Status: DC

## 2014-06-19 NOTE — Telephone Encounter (Signed)
Rx has been sent in. Pt is aware. Nothing further was needed. 

## 2014-06-30 ENCOUNTER — Telehealth: Payer: Self-pay | Admitting: Internal Medicine

## 2014-06-30 ENCOUNTER — Encounter: Payer: Medicare HMO | Admitting: *Deleted

## 2014-06-30 NOTE — Telephone Encounter (Signed)
New Message  Pt has remote check today 3/7 but is out of town. Pt will return 3/18 and can resch sometime after that. Please call back and discuss.

## 2014-06-30 NOTE — Telephone Encounter (Signed)
Rescheduled for 07-14-14. Pt agreed to this.

## 2014-07-01 ENCOUNTER — Encounter: Payer: Self-pay | Admitting: Cardiology

## 2014-07-02 ENCOUNTER — Encounter: Payer: Self-pay | Admitting: *Deleted

## 2014-07-10 ENCOUNTER — Encounter: Payer: Self-pay | Admitting: Internal Medicine

## 2014-07-14 ENCOUNTER — Ambulatory Visit (INDEPENDENT_AMBULATORY_CARE_PROVIDER_SITE_OTHER): Payer: Medicare HMO | Admitting: *Deleted

## 2014-07-14 DIAGNOSIS — Z950 Presence of cardiac pacemaker: Secondary | ICD-10-CM

## 2014-07-14 LAB — MDC_IDC_ENUM_SESS_TYPE_REMOTE
Date Time Interrogation Session: 20160321144858
Lead Channel Impedance Value: 504 Ohm
Lead Channel Impedance Value: 544 Ohm
Lead Channel Setting Pacing Pulse Width: 0.4 ms
Lead Channel Setting Sensing Sensitivity: 1.5 mV
MDC IDC MSMT BATTERY VOLTAGE: 2.88 V
MDC IDC MSMT LEADCHNL RV SENSING INTR AMPL: 13.7216
MDC IDC SET LEADCHNL RV PACING AMPLITUDE: 2.5 V
MDC IDC STAT BRADY RV PERCENT PACED: 99.77 %
Zone Setting Detection Interval: 350 ms
Zone Setting Detection Interval: 400 ms

## 2014-07-14 NOTE — Progress Notes (Signed)
Remote pacemaker transmission.   

## 2014-07-21 ENCOUNTER — Telehealth: Payer: Self-pay | Admitting: Pulmonary Disease

## 2014-07-21 MED ORDER — BUDESONIDE-FORMOTEROL FUMARATE 160-4.5 MCG/ACT IN AERO: INHALATION_SPRAY | RESPIRATORY_TRACT | Status: DC

## 2014-07-21 NOTE — Telephone Encounter (Signed)
Spoke with the pt  He states needing PA for Symbicort  [facility] [address]  Was advised by pharm that this does not require a PA  Pt aware  Refill was given  Nothing further needed

## 2014-07-30 ENCOUNTER — Encounter: Payer: Self-pay | Admitting: Cardiology

## 2014-08-07 ENCOUNTER — Encounter: Payer: Self-pay | Admitting: Internal Medicine

## 2014-08-12 ENCOUNTER — Other Ambulatory Visit: Payer: Self-pay | Admitting: *Deleted

## 2014-08-12 MED ORDER — TIOTROPIUM BROMIDE MONOHYDRATE 18 MCG IN CAPS: 18.0000 ug | ORAL_CAPSULE | Freq: Every day | RESPIRATORY_TRACT | Status: DC

## 2014-08-13 ENCOUNTER — Encounter: Payer: Self-pay | Admitting: Cardiology

## 2014-08-14 ENCOUNTER — Ambulatory Visit (INDEPENDENT_AMBULATORY_CARE_PROVIDER_SITE_OTHER): Payer: Medicare HMO | Admitting: *Deleted

## 2014-08-14 DIAGNOSIS — Z950 Presence of cardiac pacemaker: Secondary | ICD-10-CM

## 2014-08-14 LAB — MDC_IDC_ENUM_SESS_TYPE_REMOTE
Battery Voltage: 2.89 V
Brady Statistic RV Percent Paced: 99.78 %
Lead Channel Impedance Value: 480 Ohm
Lead Channel Sensing Intrinsic Amplitude: 0.786 mV
Lead Channel Setting Pacing Amplitude: 2.5 V
Lead Channel Setting Sensing Sensitivity: 1.5 mV
MDC IDC MSMT LEADCHNL RV IMPEDANCE VALUE: 520 Ohm
MDC IDC SESS DTM: 20160421143725
MDC IDC SET LEADCHNL RV PACING PULSEWIDTH: 0.4 ms
Zone Setting Detection Interval: 350 ms
Zone Setting Detection Interval: 400 ms

## 2014-08-14 NOTE — Progress Notes (Signed)
Remote pacemaker transmission.   

## 2014-08-18 ENCOUNTER — Telehealth: Payer: Self-pay | Admitting: Pulmonary Disease

## 2014-08-18 NOTE — Telephone Encounter (Signed)
I doubt it but - He can stop symbicort for a brief period & stay on spiriva

## 2014-08-18 NOTE — Telephone Encounter (Signed)
Spoke with pt. Reports increased SOB and weakness. Denies coughing, chest tightness or wheezing. States that he thinks is stemming from taking Symbicort. Declined appointment. Wants RA's recommendations.  RA - please advise. Thanks.

## 2014-08-18 NOTE — Telephone Encounter (Signed)
Pt aware of rec's per Dr Elsworth Soho.  Will call back if anything further needed.  Nothing further needed.

## 2014-08-26 ENCOUNTER — Telehealth: Payer: Self-pay | Admitting: Internal Medicine

## 2014-08-26 NOTE — Telephone Encounter (Signed)
Informed patient that he does need to continue to send monthly transmissions since his ppm is nearing ERI. Pt's next remote date was given. Patient voiced understanding.

## 2014-08-26 NOTE — Telephone Encounter (Signed)
Follow Up  Pt called to sch follow up phys pacer chk. Appt is in august. Pt req a call back to determine if the appt is too far out and if a remote check is needed in between appts// Please call

## 2014-09-12 ENCOUNTER — Encounter: Payer: Self-pay | Admitting: Cardiology

## 2014-09-16 ENCOUNTER — Encounter: Payer: Self-pay | Admitting: Internal Medicine

## 2014-09-23 ENCOUNTER — Ambulatory Visit (INDEPENDENT_AMBULATORY_CARE_PROVIDER_SITE_OTHER): Payer: Medicare HMO | Admitting: *Deleted

## 2014-09-23 ENCOUNTER — Telehealth: Payer: Self-pay | Admitting: Cardiology

## 2014-09-23 DIAGNOSIS — I495 Sick sinus syndrome: Secondary | ICD-10-CM | POA: Diagnosis not present

## 2014-09-23 NOTE — Telephone Encounter (Signed)
Attempted to confirm remote transmission with pt. No answer and was unable to leave a message.   

## 2014-09-24 NOTE — Progress Notes (Signed)
Remote pacemaker transmission.   

## 2014-09-25 ENCOUNTER — Other Ambulatory Visit: Payer: Self-pay | Admitting: Pulmonary Disease

## 2014-09-25 ENCOUNTER — Inpatient Hospital Stay (HOSPITAL_COMMUNITY)
Admission: EM | Admit: 2014-09-25 | Discharge: 2014-09-27 | DRG: 192 | Disposition: A | Discharge status: Home / Self Care | Payer: Medicare HMO | Attending: Internal Medicine | Admitting: Internal Medicine

## 2014-09-25 ENCOUNTER — Encounter (HOSPITAL_COMMUNITY): Payer: Self-pay | Admitting: Emergency Medicine

## 2014-09-25 ENCOUNTER — Emergency Department (HOSPITAL_COMMUNITY): Payer: Medicare HMO

## 2014-09-25 DIAGNOSIS — G4733 Obstructive sleep apnea (adult) (pediatric): Secondary | ICD-10-CM

## 2014-09-25 DIAGNOSIS — I272 Other secondary pulmonary hypertension: Secondary | ICD-10-CM | POA: Diagnosis present

## 2014-09-25 DIAGNOSIS — I2781 Cor pulmonale (chronic): Secondary | ICD-10-CM | POA: Diagnosis present

## 2014-09-25 DIAGNOSIS — J441 Chronic obstructive pulmonary disease with (acute) exacerbation: Secondary | ICD-10-CM | POA: Diagnosis not present

## 2014-09-25 DIAGNOSIS — D696 Thrombocytopenia, unspecified: Secondary | ICD-10-CM | POA: Diagnosis present

## 2014-09-25 DIAGNOSIS — E785 Hyperlipidemia, unspecified: Secondary | ICD-10-CM | POA: Diagnosis present

## 2014-09-25 DIAGNOSIS — Z950 Presence of cardiac pacemaker: Secondary | ICD-10-CM

## 2014-09-25 DIAGNOSIS — Z951 Presence of aortocoronary bypass graft: Secondary | ICD-10-CM

## 2014-09-25 DIAGNOSIS — I10 Essential (primary) hypertension: Secondary | ICD-10-CM | POA: Diagnosis present

## 2014-09-25 DIAGNOSIS — I2510 Atherosclerotic heart disease of native coronary artery without angina pectoris: Secondary | ICD-10-CM | POA: Diagnosis present

## 2014-09-25 DIAGNOSIS — R0902 Hypoxemia: Secondary | ICD-10-CM

## 2014-09-25 DIAGNOSIS — R0602 Shortness of breath: Secondary | ICD-10-CM | POA: Diagnosis not present

## 2014-09-25 DIAGNOSIS — Z87891 Personal history of nicotine dependence: Secondary | ICD-10-CM

## 2014-09-25 DIAGNOSIS — I4891 Unspecified atrial fibrillation: Secondary | ICD-10-CM | POA: Diagnosis present

## 2014-09-25 LAB — CBC WITH DIFFERENTIAL/PLATELET
BASOS ABS: 0 10*3/uL (ref 0.0–0.1)
BASOS PCT: 0 % (ref 0–1)
Eosinophils Absolute: 0.5 10*3/uL (ref 0.0–0.7)
Eosinophils Relative: 9 % — ABNORMAL HIGH (ref 0–5)
HCT: 46 % (ref 39.0–52.0)
Hemoglobin: 15.3 g/dL (ref 13.0–17.0)
LYMPHS ABS: 1 10*3/uL (ref 0.7–4.0)
Lymphocytes Relative: 20 % (ref 12–46)
MCH: 31.6 pg (ref 26.0–34.0)
MCHC: 33.3 g/dL (ref 30.0–36.0)
MCV: 95 fL (ref 78.0–100.0)
MONO ABS: 0.8 10*3/uL (ref 0.1–1.0)
Monocytes Relative: 15 % — ABNORMAL HIGH (ref 3–12)
NEUTROS PCT: 56 % (ref 43–77)
Neutro Abs: 2.9 10*3/uL (ref 1.7–7.7)
PLATELETS: 137 10*3/uL — AB (ref 150–400)
RBC: 4.84 MIL/uL (ref 4.22–5.81)
RDW: 14.6 % (ref 11.5–15.5)
WBC: 5.2 10*3/uL (ref 4.0–10.5)

## 2014-09-25 LAB — I-STAT TROPONIN, ED: Troponin i, poc: 0.03 ng/mL (ref 0.00–0.08)

## 2014-09-25 LAB — COMPREHENSIVE METABOLIC PANEL
ALK PHOS: 112 U/L (ref 38–126)
ALT: 32 U/L (ref 17–63)
ANION GAP: 8 (ref 5–15)
AST: 43 U/L — AB (ref 15–41)
Albumin: 3.5 g/dL (ref 3.5–5.0)
BUN: 21 mg/dL — ABNORMAL HIGH (ref 6–20)
CHLORIDE: 107 mmol/L (ref 101–111)
CO2: 25 mmol/L (ref 22–32)
CREATININE: 1.19 mg/dL (ref 0.61–1.24)
Calcium: 9 mg/dL (ref 8.9–10.3)
GFR calc non Af Amer: 55 mL/min — ABNORMAL LOW (ref >60)
GLUCOSE: 120 mg/dL — AB (ref 65–99)
POTASSIUM: 3.8 mmol/L (ref 3.5–5.1)
Sodium: 140 mmol/L (ref 135–145)
Total Bilirubin: 1.2 mg/dL (ref 0.3–1.2)
Total Protein: 6.6 g/dL (ref 6.5–8.1)

## 2014-09-25 LAB — BRAIN NATRIURETIC PEPTIDE: B NATRIURETIC PEPTIDE 5: 1155.1 pg/mL — AB (ref 0.0–100.0)

## 2014-09-25 LAB — TROPONIN I
Troponin I: 0.03 ng/mL (ref <0.031)
Troponin I: 0.03 ng/mL (ref <0.031)

## 2014-09-25 LAB — D-DIMER, QUANTITATIVE (NOT AT ARMC): D DIMER QUANT: 0.34 ug{FEU}/mL (ref 0.00–0.48)

## 2014-09-25 MED ORDER — FERROUS SULFATE 325 (65 FE) MG PO TABS
325.0000 mg | ORAL_TABLET | Freq: Every day | ORAL | Status: DC
  Administered 2014-09-26 – 2014-09-27 (×2): 325 mg via ORAL
  Filled 2014-09-25 (×2): qty 1

## 2014-09-25 MED ORDER — METHYLPREDNISOLONE SODIUM SUCC 125 MG IJ SOLR
125.0000 mg | Freq: Once | INTRAMUSCULAR | Status: AC
  Administered 2014-09-25: 125 mg via INTRAVENOUS
  Filled 2014-09-25: qty 2

## 2014-09-25 MED ORDER — BUDESONIDE 0.25 MG/2ML IN SUSP
0.2500 mg | Freq: Two times a day (BID) | RESPIRATORY_TRACT | Status: DC
  Administered 2014-09-25 – 2014-09-27 (×4): 0.25 mg via RESPIRATORY_TRACT
  Filled 2014-09-25 (×4): qty 2

## 2014-09-25 MED ORDER — RAMELTEON 8 MG PO TABS
8.0000 mg | ORAL_TABLET | Freq: Every evening | ORAL | Status: DC | PRN
  Filled 2014-09-25: qty 1

## 2014-09-25 MED ORDER — ADULT MULTIVITAMIN W/MINERALS CH
1.0000 | ORAL_TABLET | Freq: Every day | ORAL | Status: DC
  Administered 2014-09-26 – 2014-09-27 (×2): 1 via ORAL
  Filled 2014-09-25 (×2): qty 1

## 2014-09-25 MED ORDER — IPRATROPIUM BROMIDE 0.02 % IN SOLN
0.5000 mg | Freq: Once | RESPIRATORY_TRACT | Status: AC
  Administered 2014-09-25: 0.5 mg via RESPIRATORY_TRACT
  Filled 2014-09-25: qty 2.5

## 2014-09-25 MED ORDER — PREDNISONE 20 MG PO TABS
40.0000 mg | ORAL_TABLET | Freq: Every day | ORAL | Status: DC
  Administered 2014-09-26: 40 mg via ORAL
  Filled 2014-09-25: qty 2

## 2014-09-25 MED ORDER — ALBUTEROL SULFATE (2.5 MG/3ML) 0.083% IN NEBU
2.5000 mg | INHALATION_SOLUTION | RESPIRATORY_TRACT | Status: DC | PRN
  Administered 2014-09-25 – 2014-09-27 (×2): 2.5 mg via RESPIRATORY_TRACT
  Filled 2014-09-25 (×2): qty 3

## 2014-09-25 MED ORDER — FUROSEMIDE 20 MG PO TABS
20.0000 mg | ORAL_TABLET | Freq: Every day | ORAL | Status: DC
  Administered 2014-09-25: 20 mg via ORAL
  Filled 2014-09-25: qty 1

## 2014-09-25 MED ORDER — IPRATROPIUM-ALBUTEROL 0.5-2.5 (3) MG/3ML IN SOLN
3.0000 mL | Freq: Four times a day (QID) | RESPIRATORY_TRACT | Status: DC
  Administered 2014-09-25: 3 mL via RESPIRATORY_TRACT
  Filled 2014-09-25: qty 3

## 2014-09-25 MED ORDER — DABIGATRAN ETEXILATE MESYLATE 150 MG PO CAPS
150.0000 mg | ORAL_CAPSULE | Freq: Two times a day (BID) | ORAL | Status: DC
  Filled 2014-09-25: qty 1

## 2014-09-25 MED ORDER — ACETAMINOPHEN 325 MG PO TABS: 650.0000 mg | ORAL_TABLET | Freq: Four times a day (QID) | ORAL | Status: DC | PRN

## 2014-09-25 MED ORDER — ALBUTEROL SULFATE (2.5 MG/3ML) 0.083% IN NEBU
5.0000 mg | INHALATION_SOLUTION | Freq: Once | RESPIRATORY_TRACT | Status: AC
  Administered 2014-09-25: 5 mg via RESPIRATORY_TRACT
  Filled 2014-09-25: qty 6

## 2014-09-25 MED ORDER — IPRATROPIUM BROMIDE 0.03 % NA SOLN
1.0000 | Freq: Every day | NASAL | Status: DC
  Administered 2014-09-26: 1 via NASAL
  Filled 2014-09-25: qty 30

## 2014-09-25 MED ORDER — SENNOSIDES-DOCUSATE SODIUM 8.6-50 MG PO TABS: 1.0000 | ORAL_TABLET | Freq: Every evening | ORAL | Status: DC | PRN

## 2014-09-25 MED ORDER — METOPROLOL SUCCINATE ER 25 MG PO TB24
25.0000 mg | ORAL_TABLET | Freq: Two times a day (BID) | ORAL | Status: DC
  Administered 2014-09-25 – 2014-09-27 (×4): 25 mg via ORAL
  Filled 2014-09-25 (×4): qty 1

## 2014-09-25 MED ORDER — ACETAMINOPHEN 650 MG RE SUPP: 650.0000 mg | Freq: Four times a day (QID) | RECTAL | Status: DC | PRN

## 2014-09-25 MED ORDER — DABIGATRAN ETEXILATE MESYLATE 150 MG PO CAPS
150.0000 mg | ORAL_CAPSULE | Freq: Two times a day (BID) | ORAL | Status: DC
  Administered 2014-09-25 – 2014-09-27 (×4): 150 mg via ORAL
  Filled 2014-09-25 (×7): qty 1

## 2014-09-25 MED ORDER — CETYLPYRIDINIUM CHLORIDE 0.05 % MT LIQD
7.0000 mL | Freq: Two times a day (BID) | OROMUCOSAL | Status: DC
Start: 2014-09-25 — End: 2014-09-27
  Administered 2014-09-25 – 2014-09-27 (×4): 7 mL via OROMUCOSAL

## 2014-09-25 MED ORDER — CLONAZEPAM 0.5 MG PO TABS
0.5000 mg | ORAL_TABLET | Freq: Once | ORAL | Status: AC
  Administered 2014-09-25: 0.5 mg via ORAL
  Filled 2014-09-25: qty 1

## 2014-09-25 MED ORDER — ATORVASTATIN CALCIUM 40 MG PO TABS
40.0000 mg | ORAL_TABLET | Freq: Every day | ORAL | Status: DC
  Administered 2014-09-25 – 2014-09-26 (×2): 40 mg via ORAL
  Filled 2014-09-25 (×2): qty 1

## 2014-09-25 NOTE — ED Notes (Addendum)
Pt reports SOB so bad that he could not sleep; CPAP not helping. Used inhalers for COPD but did not help. No home oxygen. In process of moving and was very agitated and felt nervous and anxious. Has defibrillator and Dr. Jens Som is MD. States he was having weird dreams during night and felt very restless. 80's on RA. 94% on 2L [place] currently. Also complaining of chest tightness "its pumping really fast". Pt 65 BMP.

## 2014-09-25 NOTE — Progress Notes (Signed)
ANTICOAGULATION CONSULT NOTE - Initial Consult  Pharmacy Consult for dabigatran  Indication: atrial fibrillation  No Known Allergies  Patient Measurements: Height: 5\' 8"  (172.7 cm) Weight: 168 lb 1.6 oz (76.25 kg) IBW/kg (Calculated) : 68.4   Vital Signs: Temp: 97.5 F (36.4 C) (06/02 1444) Temp Source: Oral (06/02 1444) BP: 128/64 mmHg (06/02 1444) Pulse Rate: 67 (06/02 1444)  Labs:  Recent Labs  09/25/14 0850  HGB 15.3  HCT 46.0  PLT 137*  CREATININE 1.19    Estimated Creatinine Clearance: 46.3 mL/min (by C-G formula based on Cr of 1.19).   Medical History: Past Medical History  Diagnosis Date  . HTN (hypertension)   . Hypercholesterolemia   . Sleep apnea   . COPD (chronic obstructive pulmonary disease)   . Coronary artery disease     post CABG x4 in June, 2001  . Tachy-brady syndrome   . Atrial fibrillation     post DDD pacemaker implant  . Peripheral neuropathy      Assessment: 84yom admitted with SOB.  He is being treated for COPD exacerbation with steroids, nebs.  He has his Afib currently CVR on metoprolol.  He is on dabigatran pta for [facility].  CBC stable, Cr 1.19 stable CrCl > 80ml/min.  Goal of Therapy:   Monitor platelets by anticoagulation protocol: Yes   Plan:   Continue home dabigatran 150mg  BID Monitor renal fx s/s bleeding  Bonnita Nasuti Pharm.D. CPP, BCPS Clinical Pharmacist [phone] 09/25/2014 5:27 PM

## 2014-09-25 NOTE — ED Notes (Signed)
Pt given sandwich 

## 2014-09-25 NOTE — ED Notes (Signed)
Pt to xray without distress.

## 2014-09-25 NOTE — ED Notes (Signed)
PT ambulated to restroom without distress.  

## 2014-09-25 NOTE — H&P (Signed)
Date: 09/25/2014               Patient Name:  Roy Banks MRN: [id number]  DOB: 1930/05/30 Age / Sex: 81 y.o., male   PCP: Leonard Downing, MD         Medical Service: Internal Medicine Teaching Service         Attending Physician: Dr. Oval Linsey, MD    First Contact: Dr. Lottie Mussel Pager: [phone]  Second Contact: Dr. Michail Jewels Pager: [phone]       After Hours (After 5p/  First Contact Pager: [phone]  weekends / holidays): Second Contact Pager: [phone]   Chief Complaint: SOB  History of Present Illness: Roy Banks is a 81 year old man with COPD, atrial fibrillation s/p DDD pacemaker, CAD s/p CABG, HTN, HL, OSA who presents with SOB. He says he was in his usual state of health until he woke up overnight with SOB. He says he has it at rest. He denies orthopnea. He denies any cough but his wife notes he usually has a productive cough and corroborates that today he has none. He says he also feels wheezy with some chest tightness but not pain. He has no sick contacts. He denies any fevers, chills, night sweats, palpitations, abdominal pain, nausea, emesis, diarrhea, constipation, dysuria, weakness, numbness.  In the ED, he was found to be sating in the 80s on room air and required 2L nasal cannula. He received solumedrol 125 mg iv once, three duoneb treatments. He says his breathing feels nearly back to baseline with these interventions.  Meds: Current Facility-Administered Medications  Medication Dose Route Frequency Provider Last Rate Last Dose  . acetaminophen (TYLENOL) tablet 650 mg  650 mg Oral Q6H PRN Jones Bales, MD       Or  . acetaminophen (TYLENOL) suppository 650 mg  650 mg Rectal Q6H PRN Jones Bales, MD      . albuterol (PROVENTIL) (2.5 MG/3ML) 0.083% nebulizer solution 2.5 mg  2.5 mg Nebulization Q2H PRN Jones Bales, MD      . atorvastatin (LIPITOR) tablet 40 mg  40 mg Oral q1800 Jones Bales, MD      . budesonide (PULMICORT) nebulizer  solution 0.25 mg  0.25 mg Nebulization BID Jones Bales, MD      . dabigatran (PRADAXA) capsule 150 mg  150 mg Oral Q12H Jones Bales, MD      . ferrous sulfate tablet 325 mg  325 mg Oral Daily Jones Bales, MD      . furosemide (LASIX) tablet 20 mg  20 mg Oral Daily Jones Bales, MD      . ipratropium (ATROVENT) 0.03 % nasal spray 1 spray  1 spray Each Nare QHS Jones Bales, MD      . ipratropium-albuterol (DUONEB) 0.5-2.5 (3) MG/3ML nebulizer solution 3 mL  3 mL Nebulization Q6H Jones Bales, MD      . metoprolol succinate (TOPROL-XL) 24 hr tablet 25 mg  25 mg Oral BID Jones Bales, MD      . multivitamin with minerals tablet 1 tablet  1 tablet Oral Daily Jones Bales, MD      . Derrill Memo ON 09/26/2014] predniSONE (DELTASONE) tablet 40 mg  40 mg Oral Q breakfast Jones Bales, MD      . ramelteon (ROZEREM) tablet 8 mg  8 mg Oral QHS PRN Jones Bales, MD      . senna-docusate (Senokot-S) tablet  1 tablet  1 tablet Oral QHS PRN Jones Bales, MD        Allergies: Allergies as of 09/25/2014  . (No Known Allergies)   Past Medical History  Diagnosis Date  . HTN (hypertension)   . Hypercholesterolemia   . Sleep apnea   . COPD (chronic obstructive pulmonary disease)   . Coronary artery disease     post CABG x4 in June, 2001  . Tachy-brady syndrome   . Atrial fibrillation     post DDD pacemaker implant  . Peripheral neuropathy    Past Surgical History  Procedure Laterality Date  . Coronary artery bypass graft  10/04/1999    x4 -- LIMA graft to LAD, saphenous vein graft to the diagonal, saphenous vein graft to the first OM vessel, and saphenous vein graft to the distal RCA  . Cardiac catheterization  10/03/1999    Est. EF of 65% -- Severe left main and two-vessel obstructive atherosclerotic coronary  -- Diffuse ectasia of the right coronary artery -- Normal left ventricular function  . Right knee surgery     Family History  Problem Relation Age of  Onset  . Heart attack Father   . Heart attack Mother 27    and has had previous angioplasty   History   Social History  . Marital Status: Married    Spouse Name: N/A  . Number of Children: 2  . Years of Education: N/A   Occupational History  . small Arts development officer    Social History Main Topics  . Smoking status: Former Smoker -- 1.00 packs/day for 32 years    Types: Cigarettes    Quit date: 11/26/1982  . Smokeless tobacco: Former Systems developer    Types: [place] date: 11/26/2002     Comment: quit smoking 31 yrs ago  . Alcohol Use: No  . Drug Use: No  . Sexual Activity: Not on file   Other Topics Concern  . Not on file   Social History Narrative    Review of Systems: Review of systems negative except as noted above per HPI  Physical Exam: Blood pressure 128/64, pulse 67, temperature 97.5 F (36.4 C), temperature source Oral, resp. rate 19, height 5\' 8"  (1.727 m), weight 168 lb 1.6 oz (76.25 kg), SpO2 99 %.  Gen: A&O x 3, no acute distress, well developed, well nourished HEENT: Atraumatic, PERRL, EOMI, sclerae anicteric, moist mucous membranes Heart: Regular rate and rhythm, normal S1 S2, no murmurs, rubs, or gallops, well healed median sternotomy scar Lungs: Clear to auscultation bilaterally, respirations unlabored on 2L nasal cannula Abd: Soft, non-tender, non-distended, + bowel sounds, no hepatosplenomegaly Ext: b/l 0-1+ pitting edema to shins, cyanotic feet cool to touch but palpable pulses  Lab results: Basic Metabolic Panel:  Recent Labs  09/25/14 0850  NA 140  K 3.8  CL 107  CO2 25  GLUCOSE 120*  BUN 21*  CREATININE 1.19  CALCIUM 9.0   Liver Function Tests:  Recent Labs  09/25/14 0850  AST 43*  ALT 32  ALKPHOS 112  BILITOT 1.2  PROT 6.6  ALBUMIN 3.5   CBC:  Recent Labs  09/25/14 0850  WBC 5.2  NEUTROABS 2.9  HGB 15.3  HCT 46.0  MCV 95.0  PLT 137*   D-Dimer:  Recent Labs  09/25/14 0850  DDIMER 0.34   BNP 1155 i-stat troponin  0.03  Imaging results:  Dg Chest 2 View  09/25/2014   CLINICAL DATA:  Shortness of breath.  History of COPD and heart disease.  EXAM: CHEST  2 VIEW  COMPARISON:  PA and lateral chest 04/15/2013 and 04/03/2014.  FINDINGS: The patient is status post CABG with a pacing device in place. The chest is hyperexpanded consistent with history of COPD. Trace bilateral pleural effusions are identified. Coarsened appearance of the pulmonary interstitium is chronic. No consolidative process or pneumothorax identified.  IMPRESSION: Trace bilateral pleural effusions, greater on the right.  Emphysema.  Negative for pneumonia or pulmonary edema.   Electronically Signed   By: Inge Rise M.D.   On: 09/25/2014 10:03    Other results: EKG: ventricular pacing, no ischemic changes, compared to prior 08/23/13  Assessment & Plan by Problem: Principal Problem:   Acute exacerbation of chronic obstructive pulmonary disease (COPD) Active Problems:   Atrial fibrillation-permanent   Pacemakera dual-chamber Medtronic   CAD  Status post bypass   OSA (obstructive sleep apnea)  #COPD exacerbation: Roy Segers's SOB and cough is likely due to COPD exacerbation. CXR notes hyperexpansion and he was wheezy on exam in ED but felt much improved after iv steroids and 3 duonebs in the ED with lungs clear on our exam. PNA less likely as CXR without consolidation and afebrile with no leukocytosis. Although BNP is elevated, he has no edema on exam and lungs without crackles and CXR without pulmonary edema. He is a patient of Dr Elsworth Soho of [facility] and was last seen 03/2014 after COPD flare and told to return in 6 months or as needed. Gold stage 2 with last spirometry 2014 with FEV1 58%, post BD 65% and FVC 86%. At home he is on symbicort 2 puff bid, spiriva 18 mcg inh daily. Will not start antibiotics as he has no increased sputum volume or purulence. -duoneb q6h sch -pulmicort 0.25 mg neb bid -start prednisone 40 mg po tomorrow -O2  supplementation as needed -check pulse ox when ambulating tomorrow -telemetry -daily weight, I&O  #Atrial fibrillation and tachy-brady syndrome: Roy Markos is s/p DDD pacemaker 2009, last interrogated 07/2014. He is followed by Dr Peter Martinique of cardiology. Last seen 10/2013 and noted to be doing well with no changes in management at that time. CHADSVasc 4. He is on toprol-xl 25 mg bid for rate control and pradaxa 150 mg bid. -cont toprol-xl 25 mg bid  and pradaxa 150 mg bid -telemetry  #CAD: Roy Garman is s/p CABG 2001. He is followed by Dr Peter Martinique of cardiology with no changes made in management at that time. Last seen 10/2013. Normal Myoview 09/2011. At home he is on toprol-xl 25 mg bid, atorvastatin 40 mg daily. -cont  toprol-xl 25 mg bid, atorvastatin 40 mg daily -troponin -telemetry  #HTN: BP in ED 120s-160s/60s-90s. At home he is on toprol-xl 25 mg bid, lasix 20 mg daily, atorvastatin 40 mg daily. -cont toprol-xl 25 mg bid, lasix 20 mg daily, atorvastatin 40 mg daily  #HL: Last lipid profile 03/2012 with total cholesterol 127, LDL 64, HDL 44, triglyceride 98. At home he is on atorvastatin 40 mg daily -cont atorvastatin 40 mg daily  #OSA: Roy Dhanani is on CPAP. Sleep study in 2000. -cont CPAP  #Thrombocytopenia: Platelets 137 on admission. Previously 244 03/2013. Etiology unclear -cont to monitor  #Diet: HH  #DVT PPx: pradaxa   #Code: Full  Dispo: Disposition is deferred at this time, awaiting improvement of current medical problems. Anticipated discharge in approximately 1-2 day(s).   The patient does have a current PCP Redmond Pulling Arna Medici, MD) and does need  an [facility] hospital follow-up appointment after discharge.  The patient does not have transportation limitations that hinder transportation to clinic appointments.  Signed: Lottie Mussel, MD Internal Medicine, PGY-1 Pager [phone] 09/25/2014, 4:24 PM

## 2014-09-25 NOTE — ED Provider Notes (Signed)
CSN: [id number]     Arrival date & time 09/25/14  0806 History   First MD Initiated Contact with Patient 09/25/14 0820     Chief Complaint  Patient presents with  . Shortness of Breath     (Consider location/radiation/quality/duration/timing/severity/associated sxs/prior Treatment) HPI  Pt presenting with c/o shortness of breath.  He states that last night he was wearing his CPAP machine and felt that he couldn't get it adjusted correctly.  He was short of breath all night.  He felt very restless and like he couldn't breathe.  No fever/chills.  No cough.  Bilateral leg swelling which he states is at his baseline.  No chest pain.  Symptoms are constant.  He used his inhalers last night but without much relief.  There are no other associated systemic symptoms, there are no other alleviating or modifying factors.   Past Medical History  Diagnosis Date  . HTN (hypertension)   . Hypercholesterolemia   . Sleep apnea   . COPD (chronic obstructive pulmonary disease)   . Coronary artery disease     post CABG x4 in June, 2001  . Tachy-brady syndrome   . Atrial fibrillation     post DDD pacemaker implant  . Peripheral neuropathy    Past Surgical History  Procedure Laterality Date  . Coronary artery bypass graft  10/04/1999    x4 -- LIMA graft to LAD, saphenous vein graft to the diagonal, saphenous vein graft to the first OM vessel, and saphenous vein graft to the distal RCA  . Cardiac catheterization  10/03/1999    Est. EF of 65% -- Severe left main and two-vessel obstructive atherosclerotic coronary  -- Diffuse ectasia of the right coronary artery -- Normal left ventricular function  . Right knee surgery     Family History  Problem Relation Age of Onset  . Heart attack Father   . Heart attack Mother 96    and has had previous angioplasty   History  Substance Use Topics  . Smoking status: Former Smoker -- 1.00 packs/day for 32 years    Types: Cigarettes    Quit date: 11/26/1982  .  Smokeless tobacco: Former Systems developer    Types: [place] date: 11/26/2002     Comment: quit smoking 31 yrs ago  . Alcohol Use: No    Review of Systems  ROS reviewed and all otherwise negative except for mentioned in HPI    Allergies  Review of patient's allergies indicates no known allergies.  Home Medications   Prior to Admission medications   Medication Sig Start Date End Date Taking? Authorizing Provider  acetaminophen (TYLENOL) 325 MG tablet Take 650 mg by mouth as needed for mild pain or moderate pain.   Yes Historical Provider, MD  albuterol (PROAIR HFA) 108 (90 BASE) MCG/ACT inhaler Inhale 2 puffs into the lungs every 4 (four) hours as needed for wheezing or shortness of breath.   Yes Historical Provider, MD  atorvastatin (LIPITOR) 40 MG tablet TAKE 1 TABLET (40 MG TOTAL) BY MOUTH DAILY. 02/22/14  Yes Peter M Martinique, MD  budesonide-formoterol [facility]) 160-4.5 MCG/ACT inhaler TAKE 2 PUFFS TWICE A DAY 07/21/14  Yes Rigoberto Noel, MD  dabigatran (PRADAXA) 150 MG CAPS capsule TAKE 1 CAPSULE (150 MG TOTAL) BY MOUTH EVERY 12 (TWELVE) HOURS. 05/09/14  Yes Peter M Martinique, MD  ferrous sulfate 325 (65 FE) MG tablet Take 325 mg by mouth daily.     Yes Historical Provider, MD  furosemide (LASIX) 20  MG tablet TAKE 1 TABLET (20 MG TOTAL) BY MOUTH DAILY. 02/20/14  Yes Peter M Martinique, MD  ipratropium (ATROVENT) 0.03 % nasal spray Place 1 spray into both nostrils at bedtime. 06/19/14  Yes Rigoberto Noel, MD  metoprolol succinate (TOPROL-XL) 50 MG 24 hr tablet Take 0.5 tablets by mouth 2 (two) times daily. 10/03/13  Yes Historical Provider, MD  Multiple Vitamin (MULTI-VITAMIN PO) Take 1 tablet by mouth daily.    Yes Historical Provider, MD  tiotropium (SPIRIVA HANDIHALER) 18 MCG inhalation capsule Place 1 capsule (18 mcg total) into inhaler and inhale daily. Patient taking differently: Place 18 mcg into inhaler and inhale See admin instructions. 1-2 times a day 08/12/14  Yes Tammy S Parrett, NP   doxycycline (VIBRAMYCIN) 100 MG capsule Take 1 capsule (100 mg total) by mouth 2 (two) times daily. One po bid x 4 days 09/27/14   Jones Bales, MD  gabapentin (NEURONTIN) 300 MG capsule Take 1 capsule (300 mg total) by mouth at bedtime. 09/27/14   Jones Bales, MD  predniSONE (DELTASONE) 20 MG tablet Take 2 tablets (40 mg total) by mouth daily with breakfast. 09/27/14   Jones Bales, MD  ramelteon (ROZEREM) 8 MG tablet Take 1 tablet (8 mg total) by mouth at bedtime as needed for sleep. 09/27/14   Jones Bales, MD   BP 141/79 mmHg  Pulse 65  Temp(Src) 98.1 F (36.7 C) (Oral)  Resp 22  Ht 5\' 8"  (1.727 m)  Wt 173 lb 11.2 oz (78.79 kg)  BMI 26.42 kg/m2  SpO2 95%  Vitals reviewed Physical Exam  Physical Examination: General appearance - alert, well appearing, and in no distress Mental status - alert, oriented to person, place, and time Eyes - no conjunctival injection, no scleral icterus Mouth - mucous membranes moist, pharynx normal without lesions Chest - BSS, decreased air movement bilaterally, no frank wheeze Heart - normal rate, regular rhythm, normal S1, S2, no murmurs, rubs, clicks or gallops Abdomen - soft, nontender, nondistended, no masses or organomegaly Extremities - peripheral pulses normal, no pedal edema, no clubbing or cyanosis Skin - normal coloration and turgor, no rashes  ED Course  Procedures (including critical care time)  CRITICAL CARE Performed by: Threasa Beards Total critical care time: 40 Critical care time was exclusive of separately billable procedures and treating other patients. Critical care was necessary to treat or prevent imminent or life-threatening deterioration. Critical care was time spent personally by me on the following activities: development of treatment plan with patient and/or surrogate as well as nursing, discussions with consultants, evaluation of patient's response to treatment, examination of patient, obtaining history from  patient or surrogate, ordering and performing treatments and interventions, ordering and review of laboratory studies, ordering and review of radiographic studies, pulse oximetry and re-evaluation of patient's condition. Labs Review Labs Reviewed  CBC WITH DIFFERENTIAL/PLATELET - Abnormal; Notable for the following:    Platelets 137 (*)    Monocytes Relative 15 (*)    Eosinophils Relative 9 (*)    All other components within normal limits  BRAIN NATRIURETIC PEPTIDE - Abnormal; Notable for the following:    B Natriuretic Peptide 1155.1 (*)    All other components within normal limits  COMPREHENSIVE METABOLIC PANEL - Abnormal; Notable for the following:    Glucose, Bld 120 (*)    BUN 21 (*)    AST 43 (*)    GFR calc non Af Amer 55 (*)    All other components within normal  limits  CBC - Abnormal; Notable for the following:    Platelets 110 (*)    All other components within normal limits  BASIC METABOLIC PANEL - Abnormal; Notable for the following:    Glucose, Bld 163 (*)    BUN 21 (*)    Calcium 8.8 (*)    All other components within normal limits  BASIC METABOLIC PANEL - Abnormal; Notable for the following:    Chloride 100 (*)    Glucose, Bld 161 (*)    BUN 22 (*)    All other components within normal limits  CBC - Abnormal; Notable for the following:    Platelets 136 (*)    All other components within normal limits  D-DIMER, QUANTITATIVE (NOT AT [facility])  TROPONIN I  TROPONIN I  MAGNESIUM  I-STAT TROPOININ, ED    Imaging Review No results found.   EKG Interpretation   Date/Time:  Thursday September 25 2014 08:15:14 EDT Ventricular Rate:  65 PR Interval:  36 QRS Duration: 148 QT Interval:  457 QTC Calculation: 475 R Axis:   179 Text Interpretation:  Ventricular-paced rhythm No further analysis  attempted due to paced rhythm No significant change since last tracing  Confirmed by [facility]  MD, [place] [id number]) on 09/25/2014 8:56:39 AM      MDM   Final diagnoses:  COPD  exacerbation  Hypoxia    Pt presenting with sob, hypoxia.  CXR without signs of fluid overload or pneumonia.  Breath sounds initially were tight, increase wheezing after nebs and solumedrol.  Continuing to improve after 3rd neb in the ED.  Suspect copd exacerbaion as primary cause of symptoms.  Pt admitted to for further evaluation  11:30 AM pt is feeling somewhat improved after 2 nebs, he now has expiratory wheezing.    12:04 PM d/w teaching service for admission, Dr. Eppie Gibson attending, pt to go to telemetry bed.    Alfonzo Beers, MD 09/28/14 [phone]

## 2014-09-26 ENCOUNTER — Encounter: Payer: Self-pay | Admitting: Cardiology

## 2014-09-26 ENCOUNTER — Observation Stay (HOSPITAL_COMMUNITY): Payer: Medicare HMO

## 2014-09-26 DIAGNOSIS — Z87891 Personal history of nicotine dependence: Secondary | ICD-10-CM | POA: Diagnosis not present

## 2014-09-26 DIAGNOSIS — Z950 Presence of cardiac pacemaker: Secondary | ICD-10-CM | POA: Diagnosis not present

## 2014-09-26 DIAGNOSIS — D696 Thrombocytopenia, unspecified: Secondary | ICD-10-CM | POA: Diagnosis present

## 2014-09-26 DIAGNOSIS — R0600 Dyspnea, unspecified: Secondary | ICD-10-CM

## 2014-09-26 DIAGNOSIS — I2609 Other pulmonary embolism with acute cor pulmonale: Secondary | ICD-10-CM | POA: Diagnosis not present

## 2014-09-26 DIAGNOSIS — I495 Sick sinus syndrome: Secondary | ICD-10-CM

## 2014-09-26 DIAGNOSIS — I10 Essential (primary) hypertension: Secondary | ICD-10-CM | POA: Diagnosis present

## 2014-09-26 DIAGNOSIS — I2510 Atherosclerotic heart disease of native coronary artery without angina pectoris: Secondary | ICD-10-CM

## 2014-09-26 DIAGNOSIS — I2781 Cor pulmonale (chronic): Secondary | ICD-10-CM | POA: Diagnosis present

## 2014-09-26 DIAGNOSIS — J441 Chronic obstructive pulmonary disease with (acute) exacerbation: Secondary | ICD-10-CM | POA: Diagnosis present

## 2014-09-26 DIAGNOSIS — E785 Hyperlipidemia, unspecified: Secondary | ICD-10-CM | POA: Diagnosis present

## 2014-09-26 DIAGNOSIS — Z951 Presence of aortocoronary bypass graft: Secondary | ICD-10-CM

## 2014-09-26 DIAGNOSIS — I4891 Unspecified atrial fibrillation: Secondary | ICD-10-CM | POA: Diagnosis present

## 2014-09-26 DIAGNOSIS — G4733 Obstructive sleep apnea (adult) (pediatric): Secondary | ICD-10-CM | POA: Diagnosis present

## 2014-09-26 DIAGNOSIS — R0602 Shortness of breath: Secondary | ICD-10-CM | POA: Diagnosis present

## 2014-09-26 DIAGNOSIS — I272 Other secondary pulmonary hypertension: Secondary | ICD-10-CM | POA: Diagnosis present

## 2014-09-26 DIAGNOSIS — Z9989 Dependence on other enabling machines and devices: Secondary | ICD-10-CM

## 2014-09-26 LAB — CBC
HCT: 41.1 % (ref 39.0–52.0)
Hemoglobin: 13.5 g/dL (ref 13.0–17.0)
MCH: 31 pg (ref 26.0–34.0)
MCHC: 32.8 g/dL (ref 30.0–36.0)
MCV: 94.5 fL (ref 78.0–100.0)
Platelets: 110 10*3/uL — ABNORMAL LOW (ref 150–400)
RBC: 4.35 MIL/uL (ref 4.22–5.81)
RDW: 14.6 % (ref 11.5–15.5)
WBC: 5.2 10*3/uL (ref 4.0–10.5)

## 2014-09-26 LAB — BASIC METABOLIC PANEL
Anion gap: 9 (ref 5–15)
BUN: 21 mg/dL — AB (ref 6–20)
CALCIUM: 8.8 mg/dL — AB (ref 8.9–10.3)
CO2: 23 mmol/L (ref 22–32)
CREATININE: 1.08 mg/dL (ref 0.61–1.24)
Chloride: 105 mmol/L (ref 101–111)
GFR calc Af Amer: >60 mL/min (ref >60)
GFR calc non Af Amer: >60 mL/min (ref >60)
GLUCOSE: 163 mg/dL — AB (ref 65–99)
Potassium: 3.7 mmol/L (ref 3.5–5.1)
Sodium: 137 mmol/L (ref 135–145)

## 2014-09-26 MED ORDER — RAMELTEON 8 MG PO TABS
8.0000 mg | ORAL_TABLET | Freq: Every day | ORAL | Status: DC
  Administered 2014-09-26: 8 mg via ORAL
  Filled 2014-09-26 (×2): qty 1

## 2014-09-26 MED ORDER — FUROSEMIDE 20 MG PO TABS
20.0000 mg | ORAL_TABLET | Freq: Every day | ORAL | Status: DC
  Administered 2014-09-26 – 2014-09-27 (×2): 20 mg via ORAL
  Filled 2014-09-26 (×2): qty 1

## 2014-09-26 MED ORDER — IPRATROPIUM-ALBUTEROL 0.5-2.5 (3) MG/3ML IN SOLN
3.0000 mL | Freq: Three times a day (TID) | RESPIRATORY_TRACT | Status: DC
  Administered 2014-09-26 – 2014-09-27 (×5): 3 mL via RESPIRATORY_TRACT
  Filled 2014-09-26 (×5): qty 3

## 2014-09-26 MED ORDER — GABAPENTIN 600 MG PO TABS
300.0000 mg | ORAL_TABLET | Freq: Every day | ORAL | Status: DC
  Administered 2014-09-26: 300 mg via ORAL
  Filled 2014-09-26: qty 1

## 2014-09-26 MED ORDER — METHYLPREDNISOLONE SODIUM SUCC 125 MG IJ SOLR
60.0000 mg | Freq: Four times a day (QID) | INTRAMUSCULAR | Status: DC
  Administered 2014-09-26 – 2014-09-27 (×5): 60 mg via INTRAVENOUS
  Filled 2014-09-26 (×5): qty 2

## 2014-09-26 MED ORDER — FUROSEMIDE 10 MG/ML IJ SOLN
20.0000 mg | Freq: Once | INTRAMUSCULAR | Status: AC
  Administered 2014-09-26: 20 mg via INTRAVENOUS
  Filled 2014-09-26: qty 2

## 2014-09-26 MED ORDER — POTASSIUM CHLORIDE CRYS ER 20 MEQ PO TBCR
40.0000 meq | EXTENDED_RELEASE_TABLET | Freq: Once | ORAL | Status: AC
  Administered 2014-09-26: 40 meq via ORAL
  Filled 2014-09-26: qty 2

## 2014-09-26 MED ORDER — FUROSEMIDE 10 MG/ML IJ SOLN
20.0000 mg | Freq: Once | INTRAMUSCULAR | Status: DC
Start: 2014-09-26 — End: 2014-09-26

## 2014-09-26 MED ORDER — DOXYCYCLINE HYCLATE 100 MG PO TABS
100.0000 mg | ORAL_TABLET | Freq: Two times a day (BID) | ORAL | Status: DC
  Administered 2014-09-26 – 2014-09-27 (×3): 100 mg via ORAL
  Filled 2014-09-26 (×3): qty 1

## 2014-09-26 MED ORDER — FUROSEMIDE 20 MG PO TABS: 20.0000 mg | ORAL_TABLET | Freq: Once | ORAL | Status: DC

## 2014-09-26 NOTE — Progress Notes (Signed)
Weaned pt to 1 LNC, sats 92%

## 2014-09-26 NOTE — Evaluation (Signed)
Physical Therapy Evaluation Patient Details Name: Roy Banks MRN: [id number] DOB: Nov 24, 1930 Today's Date: 09/26/2014   History of Present Illness  Patient is an 81 yo male admitted 09/25/14 with SOB and weakness.  Patient with COPD exacerbation.  PMH:  COPD, Afib, pacemaker, CAD, CABG, HTN, OSA on CPAP, peripheral neuropathy  Clinical Impression  Patient presents with problems listed below.  Will benefit from acute PT to maximize functional mobility prior to discharge with wife.  Patient with weakness and decreased cognition, impacting functional mobility and safety.  Recommend f/u HHPT at discharge.  Patient's wife reports they hope to leave hospital on Sunday, and on Monday will be driving to new home [place] of [place]    Follow Up Recommendations Home health PT;Supervision/Assistance - 24 hour    Equipment Recommendations  None recommended by PT    Recommendations for Other Services       Precautions / Restrictions Precautions Precautions: Fall Precaution Comments: Patient has Bil drop foot Restrictions Weight Bearing Restrictions: No      Mobility  Bed Mobility Overal bed mobility: Needs Assistance Bed Mobility: Supine to Sit     Supine to sit: Min guard     General bed mobility comments: Assist for safety.  Patient with good sitting balance once upright.  Transfers Overall transfer level: Needs assistance Equipment used: Rolling walker (2 wheeled) Transfers: Sit to/from Stand Sit to Stand: Min guard         General transfer comment: Verbal cues for hand placement and safety.  Assist for safety during transfer from bed and toilet.  Ambulation/Gait Ambulation/Gait assistance: Min guard Ambulation Distance (Feet): 110 Feet Assistive device: Rolling walker (2 wheeled) Gait Pattern/deviations: Step-through pattern;Decreased step length - right;Decreased step length - left;Decreased stride length;Decreased dorsiflexion -  right;Decreased dorsiflexion - left;Steppage Gait velocity: Decreased Gait velocity interpretation: Below normal speed for age/gender General Gait Details: Verbal cues for safe use of RW.  Noted Bil foot drop with steppage gait.  Slow gait speed for safety.  Cues to keep RW with patient until in safe position to sit on bed.  Patient ambulated on room air with O2 sats dropping to 86%.  Reapplied O2 and sats increased to 95% within 1 minute.    Stairs            Wheelchair Mobility    Modified Rankin (Stroke Patients Only)       Balance Overall balance assessment: Needs assistance         Standing balance support: Bilateral upper extremity supported Standing balance-Leahy Scale: Poor                               Pertinent Vitals/Pain Pain Assessment: No/denies pain    Home Living Family/patient expects to be discharged to:: Private residence Living Arrangements: Spouse/significant other Available Help at Discharge: Family;Available 24 hours/day Type of Home: House (Per pt/wife, planning to move to new home at [facility] Monday) Home Access: Stairs to enter Entrance Stairs-Rails: Right;Left Entrance Stairs-Number of Steps: 4 Home Layout: One level Home Equipment: [place] - 4 wheels;Cane - single point;Shower seat - built in      Prior Function Level of Independence: Independent with assistive device(s);Needs assistance   Gait / Transfers Assistance Needed: Uses cane for ambulation  ADL's / Homemaking Assistance Needed: Wife prepares meals and does housework.  Unable to do yard work for past several months.  Hand Dominance        Extremity/Trunk Assessment   Upper Extremity Assessment: Overall WFL for tasks assessed           Lower Extremity Assessment: Generalized weakness;RLE deficits/detail;LLE deficits/detail RLE Deficits / Details: Decreased DF/PF at 1/5 (functional drop foot).  Hip and knee movement at 4/5 LLE Deficits / Details:  Decreased DF/PF at 1/5 (functional drop foot).  Hip and knee movement at 4/5  Cervical / Trunk Assessment: Normal  Communication   Communication: No difficulties  Cognition Arousal/Alertness: Awake/alert Behavior During Therapy: Restless;Impulsive Overall Cognitive Status: Impaired/Different from baseline (Wife reports noting increased confusion at times) Area of Impairment: Following commands;Safety/judgement;Problem solving     Memory: Decreased short-term memory Following Commands: Follows one step commands with increased time Safety/Judgement: Decreased awareness of deficits;Decreased awareness of safety   Problem Solving: Slow processing;Difficulty sequencing;Requires verbal cues      General Comments      Exercises        Assessment/Plan    PT Assessment Patient needs continued PT services  PT Diagnosis Difficulty walking;Abnormality of gait;Generalized weakness;Altered mental status   PT Problem List Decreased strength;Decreased activity tolerance;Decreased balance;Decreased mobility;Decreased cognition;Decreased knowledge of use of DME;Decreased safety awareness;Cardiopulmonary status limiting activity  PT Treatment Interventions DME instruction;Gait training;Stair training;Functional mobility training;Therapeutic activities;Cognitive remediation;Patient/family education   PT Goals (Current goals can be found in the Care Plan section) Acute Rehab PT Goals Patient Stated Goal: To be able to leave hospital on Sunday (wants to go to granddaughter's graduation) PT Goal Formulation: With patient/family Time For Goal Achievement: 10/03/14 Potential to Achieve Goals: Good    Frequency Min 3X/week   Barriers to discharge        Co-evaluation               End of Session Equipment Utilized During Treatment: Gait belt;Oxygen Activity Tolerance: Treatment limited secondary to medical complications (Comment);Patient limited by fatigue (Decreased O2 sats with  ambulation on room air) Patient left: in bed;with call bell/phone within reach;with family/visitor present (sitting EOB; Resp Therapy in for treatment) Nurse Communication: Mobility status (Decrease O2 sats on room air during gait)         Time: 1341-1406 PT Time Calculation (min) (ACUTE ONLY): 25 min   Charges:   PT Evaluation $Initial PT Evaluation Tier I: 1 Procedure PT Treatments $Gait Training: 8-22 mins   PT G Codes:        Despina Pole 2014-10-17, 2:55 PM Carita Pian. Sanjuana Kava, [place] Pager [phone]

## 2014-09-26 NOTE — Care Management Note (Addendum)
Case Management Note  Patient Details  Name: Roy Banks MRN: [id number] Date of Birth: 1931/01/17  Subjective/Objective:        Pt admitted for COPD. Pt will need [facility] services and DME 02. Per wife pt will move to [facility] [place] to leave on Monday.  CM did call MD to make them aware of plan.            Action/Plan: CM did call [facility] and they can supply DME 02 for [name].  Liaison to f/u on Saturday to see if the pt could be weaned before d/c. Referral was given to [facility]. CM did call [facility] to see if they cover [name] was left.   CM did call [facility] @ [phone]  Fax # [phone] in [name] and they service the [name] area. Company does take insurance. CM did fax information to [name] to begin 24-48 hours post d/c. No further needs from CM at this time.   Expected Discharge Date:                  Expected Discharge Plan:  [name]  In-House Referral:     Discharge planning Services  CM Consult  Post Acute Care Choice:  Durable Medical Equipment, Home Health Choice offered to:  Spouse  DME Arranged:  Oxygen DME Agency:  [place]:  PT [place]:     Status of Service:     Medicare Important Message Given:  Yes Date Medicare IM Given:  09/26/14 Medicare IM give by:  Jacqlyn Krauss, RN,BSN Date Additional Medicare IM Given:    Additional Medicare Important Message give by:     If discussed at [place] of Stay Meetings, dates discussed:    Additional Comments:  Bethena Roys, RN 09/26/2014, 3:21 PM

## 2014-09-26 NOTE — Progress Notes (Signed)
Subjective: [name] reports that his SOB feels improved from yesterday but he still feels SOB. He now has a cough that was not present yesterday. He is uneasy about discharge.  Objective: Vital signs in last 24 hours: Filed Vitals:   09/26/14 0646 09/26/14 0839 09/26/14 0847 09/26/14 1123  BP:    135/65  Pulse: 65   63  Temp:  98.2 F (36.8 C)    TempSrc:  Oral    Resp: 24     Height:      Weight:      SpO2: 92%  92%    Weight change:   Intake/Output Summary (Last 24 hours) at 09/26/14 1325 Last data filed at 09/26/14 0900  Gross per 24 hour  Intake      0 ml  Output    225 ml  Net   -225 ml   Gen: A&O x 3, no acute distress, well developed, well nourished HEENT: Atraumatic, PERRL, EOMI, sclerae anicteric, moist mucous membranes Heart: Regular rate and rhythm, normal S1 S2, no murmurs, rubs, or gallops, well healed median sternotomy scar Lungs: Clear to auscultation bilaterally, respirations unlabored on 2L nasal cannula Abd: Soft, non-tender, non-distended, + bowel sounds, no hepatosplenomegaly Ext: b/l 0-1+ pitting edema to shins, cyanotic feet cool to touch but palpable pulses  Lab Results: Basic Metabolic Panel:  Recent Labs Lab 09/25/14 0850 09/26/14 0406  NA 140 137  K 3.8 3.7  CL 107 105  CO2 25 23  GLUCOSE 120* 163*  BUN 21* 21*  CREATININE 1.19 1.08  CALCIUM 9.0 8.8*   Liver Function Tests:  Recent Labs Lab 09/25/14 0850  AST 43*  ALT 32  ALKPHOS 112  BILITOT 1.2  PROT 6.6  ALBUMIN 3.5   CBC:  Recent Labs Lab 09/25/14 0850 09/26/14 0406  WBC 5.2 5.2  NEUTROABS 2.9  --   HGB 15.3 13.5  HCT 46.0 41.1  MCV 95.0 94.5  PLT 137* 110*   Cardiac Enzymes:  Recent Labs Lab 09/25/14 1916 09/25/14 2216  TROPONINI 0.03 0.03   D-Dimer:  Recent Labs Lab 09/25/14 0850  DDIMER 0.34   BNP 1155 i-stat troponin 0.03 Troponin 0.03, 0.03  Micro Results: No results found for this or any previous visit (from the past 240  hour(s)). Studies/Results: Dg Chest 2 View  09/25/2014   CLINICAL DATA:  Shortness of breath. History of COPD and heart disease.  EXAM: CHEST  2 VIEW  COMPARISON:  PA and lateral chest 04/15/2013 and 04/03/2014.  FINDINGS: The patient is status post CABG with a pacing device in place. The chest is hyperexpanded consistent with history of COPD. Trace bilateral pleural effusions are identified. Coarsened appearance of the pulmonary interstitium is chronic. No consolidative process or pneumothorax identified.  IMPRESSION: Trace bilateral pleural effusions, greater on the right.  Emphysema.  Negative for pneumonia or pulmonary edema.   Electronically Signed   By: Inge Rise M.D.   On: 09/25/2014 10:03   Medications: I have reviewed the patient's current medications. Scheduled Meds: . antiseptic oral rinse  7 mL Mouth Rinse BID  . atorvastatin  40 mg Oral q1800  . budesonide (PULMICORT) nebulizer solution  0.25 mg Nebulization BID  . dabigatran  150 mg Oral Q12H  . doxycycline  100 mg Oral Q12H  . ferrous sulfate  325 mg Oral Daily  . furosemide  20 mg Oral Daily  . ipratropium  1 spray Each Nare QHS  . ipratropium-albuterol  3 mL Nebulization TID  .  methylPREDNISolone (SOLU-MEDROL) injection  60 mg Intravenous Q6H  . metoprolol succinate  25 mg Oral BID  . multivitamin with minerals  1 tablet Oral Daily   Continuous Infusions:  PRN Meds:.acetaminophen **OR** acetaminophen, albuterol, ramelteon, senna-docusate Assessment/Plan: Principal Problem:   Acute exacerbation of chronic obstructive pulmonary disease (COPD) Active Problems:   Atrial fibrillation-permanent   Pacemakera dual-chamber Medtronic   CAD  Status post bypass   OSA (obstructive sleep apnea)  #COPD exacerbation: [name]'s SOB and cough is likely due to COPD exacerbation. He has had much improvement with scheduled duonebs, pulmicort, and receiving iv solumedrol in ED but still does not fel back to his baseline and is on  oxygen. Today he also has a cough that was not there yesterday so will continue iv steroids and start antibiotics.  -duoneb q6h sch -pulmicort 0.25 mg neb bid -solumedrol 60 mg iv q6h -doxycycline 100 mg bid -O2 supplementation as needed -check pulse ox when ambulating if tolerated -telemetry -daily weight, I&O  #Elevated BNP: No prior history of CHF documented but BNP >1000 and LE edema. There is concern that while he has improved with COPD treatment, he is not improving as well as expected and there could be CHF component. However, no pulmonary edema on CXR. Previous myoview in 2013 with EF 65%. -lasix 20 mg iv once -TTE  #Atrial fibrillation and tachy-brady syndrome: [name] is s/p DDD pacemaker 2009, last interrogated 07/2014. He is followed by Dr Peter Martinique of cardiology. Last seen 10/2013 and noted to be doing well with no changes in management at that time. CHADSVasc 4. He is on toprol-xl 25 mg bid for rate control and pradaxa 150 mg bid. -cont toprol-xl 25 mg bid and pradaxa 150 mg bid -telemetry  #CAD: [name] is s/p CABG 2001. He is followed by Dr Peter Martinique of cardiology with no changes made in management at that time. Last seen 10/2013. Normal Myoview 09/2011. Troponin negative and EKG without ischemic changes. At home he is on toprol-xl 25 mg bid, atorvastatin 40 mg daily. -cont toprol-xl 25 mg bid, atorvastatin 40 mg daily -troponin -telemetry  #HTN: BP 110s-140s/40s-70s overnight. At home he is on toprol-xl 25 mg bid, lasix 20 mg daily, atorvastatin 40 mg daily. -cont toprol-xl 25 mg bid, lasix 20 mg daily, atorvastatin 40 mg daily  #HL: Last lipid profile 03/2012 with total cholesterol 127, LDL 64, HDL 44, triglyceride 98. At home he is on atorvastatin 40 mg daily -cont atorvastatin 40 mg daily  #OSA: [name] is on CPAP. Sleep study in 2000. -cont CPAP  #Thrombocytopenia: Platelets 137 on admission, now 110. Previously 244 03/2013. Etiology unclear -cont to  monitor  #Diet: HH  #DVT PPx: pradaxa   #Code: Full  Dispo: Disposition is deferred at this time, awaiting improvement of current medical problems.  Anticipated discharge in approximately 1 day(s).   The patient does have a current PCP Redmond Pulling Arna Medici, MD) and does need an [facility] hospital follow-up appointment after discharge.  The patient does not know have transportation limitations that hinder transportation to clinic appointments.  .Services Needed at time of discharge: Y = Yes, Blank = No PT:   OT:   RN:   Equipment:   Other:     LOS: 1 day   Kelby Aline, MD 09/26/2014, 1:25 PM

## 2014-09-26 NOTE — Progress Notes (Signed)
Placed patient on CPAP for the night via auto-mode with minimum pressure set at 5cm and maximum pressure set at 20cm.  Oxygen set at 2lpm with Sp02=92%

## 2014-09-26 NOTE — H&P (Signed)
Internal Medicine Attending Admission Note Date: 09/26/2014  Patient name: Roy Banks Medical record number: [id number] Date of birth: August 01, 1930 Age: 81 y.o. Gender: male  I saw and evaluated the patient. I reviewed the resident's note and I agree with the resident's findings and plan as documented in the resident's note.  Chief Complaint(s): Shortness of breath 1 day.  History - key components related to admission:  Roy Banks is an 81 year old man with chronic obstructive pulmonary disease, obstructive sleep apnea, coronary artery disease status post coronary artery bypass grafting 5, atrial fibrillation with sick sinus syndrome status post pacemaker, hypertension, and hyperlipidemia who was in his usual state of health until the evening prior to admission when he developed shortness of breath and associated chest tightness. He denied a cough, fevers, shakes, chills, sick contacts, palpitations, or orthopnea. In the emergency department his room air oxygen saturations were 80% and he was felt to have a COPD exacerbation. He was admitted to the internal medicine teaching service for further evaluation and care.  When he was seen on rounds today he noted improvement in his dyspnea since admission. That being said, he now has a cough productive of sputum and is not quite at baseline. He is without other complaints at this time.  Physical Exam - key components related to admission:  Filed Vitals:   09/26/14 1353 09/26/14 1359 09/26/14 1553 09/26/14 1711  BP:    148/69  Pulse:   75 65  Temp:    97.6 F (36.4 C)  TempSrc:    Oral  Resp:      Height:      Weight:      SpO2: 95% 96% 94% 99%   Gen.: Well-developed, well-nourished man sitting comfortably in bed in no acute distress. He is able to speak in full sentences. Heart: Regular rate and rhythm without murmurs, rubs, or gallops. Lungs: Distant breath sounds with moderately poor air movement and no wheezes, rhonchi, or  rales. Extremities: Trace pitting edema to the mid shins bilaterally.  Lab results:  Basic Metabolic Panel:  Recent Labs  09/25/14 0850 09/26/14 0406  NA 140 137  K 3.8 3.7  CL 107 105  CO2 25 23  GLUCOSE 120* 163*  BUN 21* 21*  CREATININE 1.19 1.08  CALCIUM 9.0 8.8*   Liver Function Tests:  Recent Labs  09/25/14 0850  AST 43*  ALT 32  ALKPHOS 112  BILITOT 1.2  PROT 6.6  ALBUMIN 3.5   CBC:  Recent Labs  09/25/14 0850 09/26/14 0406  WBC 5.2 5.2  NEUTROABS 2.9  --   HGB 15.3 13.5  HCT 46.0 41.1  MCV 95.0 94.5  PLT 137* 110*   Cardiac Enzymes:  Recent Labs  09/25/14 1916 09/25/14 2216  TROPONINI 0.03 0.03   D-Dimer:  Recent Labs  09/25/14 0850  DDIMER 0.34   Misc. Labs:  BNP 1155  Imaging results:  Dg Chest 2 View  09/25/2014   CLINICAL DATA:  Shortness of breath. History of COPD and heart disease.  EXAM: CHEST  2 VIEW  COMPARISON:  PA and lateral chest 04/15/2013 and 04/03/2014.  FINDINGS: The patient is status post CABG with a pacing device in place. The chest is hyperexpanded consistent with history of COPD. Trace bilateral pleural effusions are identified. Coarsened appearance of the pulmonary interstitium is chronic. No consolidative process or pneumothorax identified.  IMPRESSION: Trace bilateral pleural effusions, greater on the right.  Emphysema.  Negative for pneumonia or pulmonary edema.   Electronically  Signed   By: Inge Rise M.D.   On: 09/25/2014 10:03   Other results:  EKG: 100% ventricularly paced  Assessment & Plan by Problem:  Roy Banks is an 81 year old man with a history of chronic obstructive pulmonary disease, coronary artery disease status post CABG, obstructive sleep apnea, atrial fibrillation, and sick sinus syndrome requiring pacemaker placement who presents with a one-day history of shortness of breath. He now has a productive cough. His symptoms are consistent with an acute exacerbation of chronic obstructive  pulmonary disease and he has responded to bronchodilators and steroids. The cause of the elevated BNP is unclear but likely related to chronic pulmonary hypertension.  1) Acute exacerbation of chronic obstructive pulmonary disease: We will continue IV Solu-Medrol for one day before converting to oral prednisone. We will also continue broncho-dilator therapy and start doxycycline 100 mg by mouth twice daily for one week.  2) Disposition: I suspect he will be ready for discharge home within the next 24 hours.

## 2014-09-26 NOTE — Evaluation (Signed)
Occupational Therapy Evaluation Patient Details Name: Roy Banks MRN: [id number] DOB: December 27, 1930 Today's Date: 09/26/2014    History of Present Illness Patient is an 81 yo male admitted 09/25/14 with SOB and weakness.  Patient with COPD exacerbation.  PMH:  COPD, Afib, pacemaker, CAD, CABG, HTN, OSA on CPAP, peripheral neuropathy   Clinical Impression   Pt currently with significant impairments in balance as well as endurance and activity tolerance.  Oxygen sats decreased to 82% on room air with activity in the room, requiring supplemental O2 at 2-3 Ls with purse lip breathing for 1-2 mins.  Increased LOB posteriorly with initial standing and with functional transfers without use of a RW.  Feel he will continue to need use of RW for support at discharge as well.  Pt and family have been educated on energy conservation strategies and provided with handout at this time.  Recommend continued HHOT for continued education, endurance building, and strengthening.  No further acute needs at this time as pt will likely discharge home this weekend.  Wife voices and agrees with evaluation.     Follow Up Recommendations  Home health OT    Equipment Recommendations  None recommended by OT       Precautions / Restrictions Precautions Precautions: Fall Precaution Comments: Patient has Bil drop foot Restrictions Weight Bearing Restrictions: No      Mobility Bed Mobility Overal bed mobility: Needs Assistance Bed Mobility: Supine to Sit     Supine to sit: Min guard     General bed mobility comments: Assist for safety.  Patient with good sitting balance once upright.  Transfers Overall transfer level: Needs assistance Equipment used: 1 person hand held assist Transfers: Sit to/from Stand Sit to Stand: Min assist         General transfer comment: Pt with LOB posteriorly with sit to stand transitions requiring min assist to self correct.      Balance Overall balance assessment: Needs  assistance   Sitting balance-Leahy Scale: Good     Standing balance support: Bilateral upper extremity supported Standing balance-Leahy Scale: Poor Standing balance comment: Pt relies on EOB for LE stability to keep his balance with initial sit to stand.                              ADL Overall ADL's : Needs assistance/impaired     Grooming: Wash/dry hands;Standing;Minimal assistance   Upper Body Bathing: Set up;Sitting   Lower Body Bathing: Supervison/ safety;Sit to/from stand   Upper Body Dressing : Supervision/safety;Sitting   Lower Body Dressing: Sit to/from stand;Minimal assistance   Toilet Transfer: Minimal assistance;Regular Toilet;Ambulation   Toileting- Clothing Manipulation and Hygiene: Minimal assistance;Sit to/from stand   Tub/ Shower Transfer: Walk-in shower;Minimal assistance;Ambulation;Shower seat   Functional mobility during ADLs: Minimal assistance General ADL Comments: Pt with overall limited endurance with O2 sats decreasing to 82% with LB dressing tasks and post ambulation to the sink, both without supplemental O2.  Pt needs 2-3 Ls nasal cannula to increase back above 90% after 1-2 mins.  Pt also reporting weakness in his LEs after standing for a couple of mins as well, requring rest break.  [place]daughters graduation.  Feel he will likely need O2 as well as follow-up HHOT.       Vision Vision Assessment?: No apparent visual deficits   Perception Perception Perception Tested?: Yes   Praxis Praxis Praxis tested?: Within functional  limits    Pertinent Vitals/Pain Pain Assessment: No/denies pain     Hand Dominance Right   Extremity/Trunk Assessment Upper Extremity Assessment Upper Extremity Assessment: Overall WFL for tasks assessed   Lower Extremity Assessment Lower Extremity Assessment: Defer to PT evaluation RLE Deficits / Details: Decreased DF/PF at 1/5 (functional drop foot).  Hip and knee  movement at 4/5 RLE Sensation: history of peripheral neuropathy RLE Coordination: decreased gross motor LLE Deficits / Details: Decreased DF/PF at 1/5 (functional drop foot).  Hip and knee movement at 4/5 LLE Sensation: history of peripheral neuropathy LLE Coordination: decreased gross motor   Cervical / Trunk Assessment Cervical / Trunk Assessment: Normal   Communication Communication Communication: No difficulties   Cognition Arousal/Alertness: Awake/alert Behavior During Therapy: WFL for tasks assessed/performed Overall Cognitive Status: Within Functional Limits for tasks assessed Area of Impairment: Following commands;Safety/judgement;Problem solving     Memory: Decreased short-term memory Following Commands: Follows one step commands with increased time Safety/Judgement: Decreased awareness of deficits;Decreased awareness of safety   Problem Solving: Slow processing;Difficulty sequencing;Requires verbal cues                Home Living Family/patient expects to be discharged to:: Private residence Living Arrangements: Spouse/significant other Available Help at Discharge: Family;Available 24 hours/day Type of Home: House (Per pt/wife, planning to move to new home at [facility] Monday) Home Access: Stairs to enter Entrance Stairs-Number of Steps: 4 Entrance Stairs-Rails: Right;Left Home Layout: One level     Bathroom Shower/Tub: Occupational psychologist: [place]: Environmental consultant - 4 wheels;Cane - single point;Shower seat - built in          Prior Functioning/Environment Level of Independence: Independent with assistive device(s);Needs assistance  Gait / Transfers Assistance Needed: Uses cane for ambulation ADL's / Homemaking Assistance Needed: Wife prepares meals and does housework.  Unable to do yard work for past several months.           OT Problem List: Decreased strength;Decreased activity tolerance;Impaired balance (sitting and/or  standing);Cardiopulmonary status limiting activity;Decreased knowledge of use of DME or AE      OT Goals(Current goals can be found in the care plan section) Acute Rehab OT Goals Patient Stated Goal: To be able to leave hospital on Sunday (wants to go to granddaughter's graduation)  OT Frequency:                End of Session Equipment Utilized During Treatment: Oxygen Nurse Communication: Mobility status  Activity Tolerance: Patient limited by fatigue Patient left: in bed;with call bell/phone within reach;with family/visitor present   Time: 1511-1550 OT Time Calculation (min): 39 min Charges:  OT General Charges $OT Visit: 1 Procedure OT Evaluation $Initial OT Evaluation Tier I: 1 Procedure OT Treatments $Self Care/Home Management : 23-37 mins  Bernarr Longsworth OTR/L 09/26/2014, 4:03 PM

## 2014-09-26 NOTE — Progress Notes (Signed)
SATURATION QUALIFICATIONS: (This note is used to comply with regulatory documentation for home oxygen)  Patient Saturations on Room Air at Rest = 91%  Patient Saturations on Room Air while Ambulating = 86%  Patient Saturations on 2 Liters of oxygen following Ambulating = 95%  Please briefly explain why patient needs home oxygen:  Desaturation during ambulation on room air.   Carita Pian Sanjuana Kava, [place] Pager [phone]

## 2014-09-26 NOTE — Progress Notes (Signed)
  Echocardiogram 2D Echocardiogram has been performed.  Thanh Pomerleau 09/26/2014, 3:06 PM

## 2014-09-26 NOTE — Discharge Instructions (Addendum)
Please keep your follow-up appointments; this is very important for your continued recovery.    We have made the following additions/changes to your medications:   You have be given a medication for sleep, rozerem.  Please take this as needed at bedtime.  You have also been given a medication for restless leg called gabapentin.  Please take this daily at bedtime.    For your COPD, please take 2 tablets of prednisone (40mg ) at breakfast for 5 days then stop. Additionally, you have been given an antibiotic, doxycycline to take twice daily for 4 more days then stop.   We have worked it out for you to be able to get your oxygen.     Please continue to take all of your medications as prescribed.  Do not miss any doses without contacting your primary physician.  If you have questions, please contact your physician or contact the Internal Medicine Teaching Service at [phone].  Please bring your medicications with you to your appointments; medications may be eye drops, herbals, vitamins, or pills.    If you believe you are suffering from a life-threatening emergency, go to the nearest Emergency Department.

## 2014-09-27 DIAGNOSIS — I2609 Other pulmonary embolism with acute cor pulmonale: Secondary | ICD-10-CM

## 2014-09-27 LAB — CBC
HEMATOCRIT: 46.4 % (ref 39.0–52.0)
HEMOGLOBIN: 15.4 g/dL (ref 13.0–17.0)
MCH: 31.4 pg (ref 26.0–34.0)
MCHC: 33.2 g/dL (ref 30.0–36.0)
MCV: 94.7 fL (ref 78.0–100.0)
Platelets: 136 10*3/uL — ABNORMAL LOW (ref 150–400)
RBC: 4.9 MIL/uL (ref 4.22–5.81)
RDW: 14.7 % (ref 11.5–15.5)
WBC: 7.3 10*3/uL (ref 4.0–10.5)

## 2014-09-27 LAB — BASIC METABOLIC PANEL
ANION GAP: 10 (ref 5–15)
BUN: 22 mg/dL — ABNORMAL HIGH (ref 6–20)
CALCIUM: 9.1 mg/dL (ref 8.9–10.3)
CHLORIDE: 100 mmol/L — AB (ref 101–111)
CO2: 27 mmol/L (ref 22–32)
Creatinine, Ser: 1.01 mg/dL (ref 0.61–1.24)
GFR calc Af Amer: >60 mL/min (ref >60)
GFR calc non Af Amer: >60 mL/min (ref >60)
GLUCOSE: 161 mg/dL — AB (ref 65–99)
Potassium: 4.2 mmol/L (ref 3.5–5.1)
Sodium: 137 mmol/L (ref 135–145)

## 2014-09-27 LAB — MAGNESIUM: MAGNESIUM: 2 mg/dL (ref 1.7–2.4)

## 2014-09-27 MED ORDER — PREDNISONE 20 MG PO TABS: 40.0000 mg | ORAL_TABLET | Freq: Every day | ORAL | Status: DC

## 2014-09-27 MED ORDER — GABAPENTIN 300 MG PO CAPS: 300.0000 mg | ORAL_CAPSULE | Freq: Every day | ORAL | Status: DC

## 2014-09-27 MED ORDER — DOXYCYCLINE HYCLATE 100 MG PO CAPS: 100.0000 mg | ORAL_CAPSULE | Freq: Two times a day (BID) | ORAL | Status: DC

## 2014-09-27 MED ORDER — RAMELTEON 8 MG PO TABS: 8.0000 mg | ORAL_TABLET | Freq: Every evening | ORAL | Status: DC | PRN

## 2014-09-27 NOTE — Progress Notes (Signed)
SATURATION QUALIFICATIONS: (This note is used to comply with regulatory documentation for home oxygen)  Patient Saturations on Room Air at Rest = 89%  Patient Saturations on Room Air while Ambulating = 84%  Patient Saturations on 2 Liters of oxygen while Ambulating = 93-96%  Please briefly explain why patient needs home oxygen: Patient desaturates with mobility on room air.   PT note to follow. Carita Pian Sanjuana Kava, [place] Pager [phone]

## 2014-09-27 NOTE — Progress Notes (Signed)
Physical Therapy Treatment Patient Details Name: Roy Banks MRN: [id number] DOB: 1930/05/03 Today's Date: 09/27/2014    History of Present Illness Patient is an 81 yo male admitted 09/25/14 with SOB and weakness.  Patient with COPD exacerbation.  PMH:  COPD, Afib, pacemaker, CAD, CABG, HTN, OSA on CPAP, peripheral neuropathy    PT Comments    Patient continues to have decreased gait stability and activity tolerance, impacting functional mobility.  Patient desaturates to 84% with gait on room air.  Reapplied O2 and O2 sats increased to 93-96%.  Continue to recommend HHPT at discharge for continued therapy.   Follow Up Recommendations  Home health PT;Supervision/Assistance - 24 hour     Equipment Recommendations  None recommended by PT    Recommendations for Other Services       Precautions / Restrictions Precautions Precautions: Fall Precaution Comments: Patient has Bil drop foot Restrictions Weight Bearing Restrictions: No    Mobility  Bed Mobility               General bed mobility comments: Sitting EOB as PT entered room.  Able to don shoes independently.  Transfers Overall transfer level: Needs assistance Equipment used: Rolling walker (2 wheeled) Transfers: Sit to/from Stand Sit to Stand: Min guard         General transfer comment: Assist for safety.  Cues to move slowly.  Ambulation/Gait Ambulation/Gait assistance: Min guard Ambulation Distance (Feet): 82 Feet Assistive device: Rolling walker (2 wheeled) Gait Pattern/deviations: Step-through pattern;Decreased stride length;Decreased dorsiflexion - right;Decreased dorsiflexion - left;Steppage;Trunk flexed Gait velocity: Decreased Gait velocity interpretation: Below normal speed for age/gender General Gait Details: Verbal cues to move at a slow speed for safety.  Assist for safety.  Fatigues quickly today, with O2 sats dropping to 84% with ambulation on room air.   Stairs            Wheelchair  Mobility    Modified Rankin (Stroke Patients Only)       Balance             Standing balance-Leahy Scale: Poor Standing balance comment: Requires UE support for balance                    Cognition Arousal/Alertness: Awake/alert Behavior During Therapy: WFL for tasks assessed/performed;Anxious;Impulsive           Following Commands: Follows one step commands with increased time Safety/Judgement: Decreased awareness of safety   Problem Solving: Slow processing;Difficulty sequencing;Requires verbal cues      Exercises      General Comments        Pertinent Vitals/Pain Pain Assessment: No/denies pain  O2 sats with gait on room air:  84% Reapplied O2:  93-96%      Home Living                      Prior Function            PT Goals (current goals can now be found in the care plan section) Progress towards PT goals: Progressing toward goals    Frequency  Min 3X/week    PT Plan Current plan remains appropriate    Co-evaluation             End of Session Equipment Utilized During Treatment: Gait belt;Oxygen Activity Tolerance: Patient limited by fatigue;Treatment limited secondary to medical complications (Comment) (Decreased O2 sats on room air) Patient left: in bed;with call bell/phone within reach;with family/visitor present (sitting EOB)  Time: 9444-6190 PT Time Calculation (min) (ACUTE ONLY): 19 min  Charges:  $Gait Training: 8-22 mins                    G Codes:      Despina Pole 14-Oct-2014, 4:18 PM Carita Pian. Sanjuana Kava, [place] Pager [phone]

## 2014-09-27 NOTE — Progress Notes (Signed)
Internal Medicine Attending  Date: 09/27/2014  Patient name: Roy Banks Medical record number: [id number] Date of birth: April 11, 1931 Age: 81 y.o. Gender: male  I saw and evaluated the patient. I reviewed the resident's note by Dr. Gordy Levan and I agree with the resident's findings and plans as documented in her progress note.  From a pulmonary standpoint he felt improved today compared to yesterday. He states he's close to baseline and feels comfortable being discharged to a hotel so that he can see his granddaughter graduated from high school tomorrow. Case management his help to arrange oxygen therapy for the whole tell and then subsequently to his new home in [place]. On examination he is moving much better air then yesterday.

## 2014-09-27 NOTE — Progress Notes (Signed)
Subjective:  Pt was still sleeping this AM with CPAP.  He states his breathing is much better than on admission.  He hasn't been up walking around this AM to tell if he is back to baseline.  Had good UOP yesterday.  Down 1.5L since admission.  Has good appetite without N/V/D.     Objective: Vital signs in last 24 hours: Filed Vitals:   09/27/14 0408 09/27/14 0528 09/27/14 0908 09/27/14 0950  BP:  149/75  141/79  Pulse: 65 65  65  Temp:  97.7 F (36.5 C)    TempSrc:  Oral    Resp: 22     Height:      Weight:  78.79 kg (173 lb 11.2 oz)    SpO2: 97%  94%    Weight change: 2.54 kg (5 lb 9.6 oz)  Intake/Output Summary (Last 24 hours) at 09/27/14 1112 Last data filed at 09/27/14 1039  Gross per 24 hour  Intake    240 ml  Output   1580 ml  Net  -1340 ml   Gen: A&O x 3, no acute distress, well developed, well nourished HEENT: Atraumatic, EOMI, sclerae anicteric, moist mucous membranes Heart: Regular rate and rhythm, normal S1 S2, no murmurs, rubs, or gallops, well healed median sternotomy scar Lungs: Clear to auscultation bilaterally, no wheezing, crackles, rhonchi, better air movement today  Abd: Soft, non-tender, non-distended, + bowel sounds, no hepatosplenomegaly Ext: b/l trace pitting edema to shins, cyanotic feet cool to touch but palpable pulses  Lab Results: Basic Metabolic Panel:  Recent Labs Lab 09/26/14 0406 09/27/14 0340  NA 137 137  K 3.7 4.2  CL 105 100*  CO2 23 27  GLUCOSE 163* 161*  BUN 21* 22*  CREATININE 1.08 1.01  CALCIUM 8.8* 9.1  MG  --  2.0   Liver Function Tests:  Recent Labs Lab 09/25/14 0850  AST 43*  ALT 32  ALKPHOS 112  BILITOT 1.2  PROT 6.6  ALBUMIN 3.5   CBC:  Recent Labs Lab 09/25/14 0850 09/26/14 0406 09/27/14 0340  WBC 5.2 5.2 7.3  NEUTROABS 2.9  --   --   HGB 15.3 13.5 15.4  HCT 46.0 41.1 46.4  MCV 95.0 94.5 94.7  PLT 137* 110* 136*   Cardiac Enzymes:  Recent Labs Lab 09/25/14 1916 09/25/14 2216    TROPONINI 0.03 0.03   D-Dimer:  Recent Labs Lab 09/25/14 0850  DDIMER 0.34   BNP 1155 i-stat troponin 0.03 Troponin 0.03, 0.03  Micro Results: No results found for this or any previous visit (from the past 240 hour(s)). Studies/Results: No results found. Medications: I have reviewed the patient's current medications. Scheduled Meds: . antiseptic oral rinse  7 mL Mouth Rinse BID  . atorvastatin  40 mg Oral q1800  . budesonide (PULMICORT) nebulizer solution  0.25 mg Nebulization BID  . dabigatran  150 mg Oral Q12H  . doxycycline  100 mg Oral Q12H  . ferrous sulfate  325 mg Oral Daily  . furosemide  20 mg Oral Daily  . gabapentin  300 mg Oral QHS  . ipratropium  1 spray Each Nare QHS  . ipratropium-albuterol  3 mL Nebulization TID  . methylPREDNISolone (SOLU-MEDROL) injection  60 mg Intravenous Q6H  . metoprolol succinate  25 mg Oral BID  . multivitamin with minerals  1 tablet Oral Daily  . ramelteon  8 mg Oral QHS   Continuous Infusions:  PRN Meds:.acetaminophen **OR** acetaminophen, albuterol, senna-docusate Assessment/Plan: Principal Problem:   Acute exacerbation  of chronic obstructive pulmonary disease (COPD) Active Problems:   Atrial fibrillation-permanent   Pacemakera dual-chamber Medtronic   CAD  Status post bypass   OSA (obstructive sleep apnea)   Cor pulmonale  Likely COPD exacerbation: Breathing improved with duonebs, solumedrol, doxycycline, and IV lasix yesterday.  Saturating 96% on RA while lying.  He was still waking up this AM so unclear if he is back to baseline.  Maybe able to d/c today pending how he feels with ambulation.  No CP.  Good UOP.   -cont duoneb q6h sch,pulmicort 0.25 mg neb bid -d/c solumedrol, add prednisone 40mg  qd  -cont doxycycline 100 mg bid -check pulse ox when ambulating -d/c telemetry  -cont daily weight, I&O  Cor pulmonale: No prior history of CHF documented but BNP >1000 and LE edema. Previous myoview in 2013 with EF 65%.   Repeat TTE yesterday shows LVEF of 60% with mild LVH, AV sclerosis.  RV dilated and systolic function moderately reduced.  PA pressure increased to 52mmHg.  He is followed by Dr. Martinique and has f/u with the PA on 6/30.  Received 1 dose of IV lasix 20mg  yesterday with good UOP.  Down 1.5L since admission.   -resume home dose of lasix, may need to increase  -keep O2 sat>90%, order for home O2 placed  -f/u with cardiology for possible RHC  Afib and tachy-brady syndrome: s/p DDD pacemaker 2009, last interrogated 07/2014, followed by Dr. Martinique.  CHADSVasc 4. He is on toprol-xl 25 mg bid for rate control and pradaxa 150 mg bid. -cont current meds  CAD s/p CABG: Troponins neg and EKG without ischemic changes.  -conttoprol-xl 25 mg bid, atorvastatin 40 mg daily  Mild thrombocytopenia: Plts 137 on admission, now 136.  Previously 244 03/2013. Etiology unclear -cont to monitor  DVT PPx: pradaxa   Code: Full  Dispo: Discharge possibly later today with [facility] and close f/u with PCP and cardiology.  Order for home O2 also placed.     The patient does have a current PCP Redmond Pulling Arna Medici, MD) and does need an [facility] hospital follow-up appointment after discharge.    LOS: 2 days   Jones Bales, MD 09/27/2014, 11:12 AM

## 2014-09-27 NOTE — Plan of Care (Signed)
Problem: Acute Rehab PT Goals(only PT should resolve) Goal: Pt Will Go Up/Down Stairs Outcome: Not Applicable Date Met:  21/11/55 No stairs at new home

## 2014-09-27 NOTE — Care Management Note (Signed)
Case Management Note  Patient Details  Name: Roy Banks MRN: [id number] Date of Birth: [birth year]  Subjective/Objective:                  Shortness of breath 1 day  Action/Plan:  Discharge planning Expected Discharge Date:  09/27/14               Expected Discharge Plan:  [place]  In-House Referral:     Discharge planning Services  CM Consult  Post Acute Care Choice:  Durable Medical Equipment, Home Health Choice offered to:  Spouse  DME Arranged:  Oxygen DME Agency:  [facility]  HH Arranged:  PT [place] Agency:     Status of Service:     Medicare Important Message Given:  Yes Date Medicare IM Given:  09/26/14 Medicare IM give by:  Jacqlyn Krauss, RN,BSN Date Additional Medicare IM Given:    Additional Medicare Important Message give by:     If discussed at [place] of Stay Meetings, dates discussed:    Additional Comments: CM called [facility] DME rep, Jeneen Rinks and unfortunately, he states [facility] will not be able to provide a continuum of home oxygen care from here to the hotel and then from the hotel to the pt's new address in [facility].  Cm called [facility] who states they will be happy to provide this complex arrangement.  Per [facility] rep request, CM leaving facesheet, F2F, orders, pulmonary saturation note, and H&P and rep will meet the family in the room with oxygen to take to the hotel and then is planning to meet with the family when they check out of the hotel and provide oxygen for the ride to their new home in [facility] and will provide the setup through their [facility] branch.  Cm made family, MD, and RN aware of the arrangement.  Cm called [place] DME rep for the wheelchair and cushion to be delivered to the room so family can discharge.  No other CM needs were communicated. Dellie Catholic, RN 09/27/2014, 4:03 PM

## 2014-09-28 LAB — CUP PACEART REMOTE DEVICE CHECK
Battery Voltage: 2.83 V
Brady Statistic RV Percent Paced: 98.72 %
Date Time Interrogation Session: 20160531202705
Lead Channel Setting Pacing Amplitude: 2.5 V
Lead Channel Setting Pacing Pulse Width: 0.4 ms
Lead Channel Setting Sensing Sensitivity: 1.5 mV
MDC IDC MSMT LEADCHNL RA IMPEDANCE VALUE: 528 Ohm
MDC IDC MSMT LEADCHNL RV IMPEDANCE VALUE: 496 Ohm
MDC IDC MSMT LEADCHNL RV SENSING INTR AMPL: 13.293 mV
MDC IDC SET ZONE DETECTION INTERVAL: 350 ms
MDC IDC SET ZONE DETECTION INTERVAL: 400 ms

## 2014-09-29 NOTE — Discharge Summary (Signed)
Name: Roy Banks MRN: [id number] DOB: 11-15-1930 81 y.o. PCP: Leonard Downing, MD  Date of Admission: 09/25/2014  8:08 AM Date of Discharge: 09/27/2014 Attending Physician: Oval Linsey, MD Discharge Diagnosis:  Principal Problem:   Acute exacerbation of chronic obstructive pulmonary disease (COPD) Active Problems:   Atrial fibrillation-permanent   Pacemakera dual-chamber Medtronic   CAD  Status post bypass   OSA (obstructive sleep apnea)   Cor pulmonale  Discharge Medications:   Medication List    STOP taking these medications        clonazePAM 1 MG tablet  Commonly known as:  KLONOPIN     MELATONIN PO  Replaced by:  ramelteon 8 MG tablet      TAKE these medications        acetaminophen 325 MG tablet  Commonly known as:  TYLENOL  Take 650 mg by mouth as needed for mild pain or moderate pain.     atorvastatin 40 MG tablet  Commonly known as:  LIPITOR  TAKE 1 TABLET (40 MG TOTAL) BY MOUTH DAILY.     budesonide-formoterol 160-4.5 MCG/ACT inhaler  Commonly known as:  SYMBICORT  TAKE 2 PUFFS TWICE A DAY     dabigatran 150 MG Caps capsule  Commonly known as:  PRADAXA  TAKE 1 CAPSULE (150 MG TOTAL) BY MOUTH EVERY 12 (TWELVE) HOURS.     doxycycline 100 MG capsule  Commonly known as:  VIBRAMYCIN  Take 1 capsule (100 mg total) by mouth 2 (two) times daily. One po bid x 4 days  Notes to Patient:  This evening     ferrous sulfate 325 (65 FE) MG tablet  Take 325 mg by mouth daily.     furosemide 20 MG tablet  Commonly known as:  LASIX  TAKE 1 TABLET (20 MG TOTAL) BY MOUTH DAILY.     gabapentin 300 MG capsule  Commonly known as:  NEURONTIN  Take 1 capsule (300 mg total) by mouth at bedtime.     ipratropium 0.03 % nasal spray  Commonly known as:  ATROVENT  Place 1 spray into both nostrils at bedtime.     metoprolol succinate 50 MG 24 hr tablet  Commonly known as:  TOPROL-XL  Take 0.5 tablets by mouth 2 (two) times daily.     MULTI-VITAMIN PO  Take  1 tablet by mouth daily.     predniSONE 20 MG tablet  Commonly known as:  DELTASONE  Take 2 tablets (40 mg total) by mouth daily with breakfast.     PROAIR HFA 108 (90 BASE) MCG/ACT inhaler  Generic drug:  albuterol  Inhale 2 puffs into the lungs every 4 (four) hours as needed for wheezing or shortness of breath.     ramelteon 8 MG tablet  Commonly known as:  ROZEREM  Take 1 tablet (8 mg total) by mouth at bedtime as needed for sleep.     tiotropium 18 MCG inhalation capsule  Commonly known as:  SPIRIVA HANDIHALER  Place 1 capsule (18 mcg total) into inhaler and inhale daily.        Disposition and follow-up:   [name] was discharged from [facility] in [place] condition.  At the hospital follow up visit please address:  1.  Oxygen requirement, pulmonary HTN  2.  Labs / imaging needed at time of follow-up: CBC, cardiology consider R heart catheterization  3.  Pending labs/ test needing follow-up: none  Follow-up Appointments: Follow-up Information    Follow up  with Noralee Space, MD On 10/10/2014.   Specialty:  Pulmonary Disease   Why:  @ 10 am, pulmonology   Contact information:   [address] [place] [address] [phone]       Follow up with Ermalinda Barrios, PA-C On 10/23/2014.   Specialty:  Cardiology   Why:  @ 10:15 am, cardiology   Contact information:   [address] [place] [address] [phone]       Follow up with Leonard Downing, MD On 10/06/2014.   Specialty:  Family Medicine   Why:  @ 10:30 am   Contact information:   [place] [address] [phone]       Follow up with [facility].   Why:  Oxygen   Contact information:   [address] [place] [address] [phone]       Follow up with [facility].   Why:  Physical Therapy and Occupational Therapy   Contact information:   [address], [place], [place]  [address] [phone]      Follow up with [facility]   Why:  home oxygen   Contact information:   [place] [address] [phone]       Discharge Instructions: Discharge Instructions    (HEART FAILURE PATIENTS) Call MD:  Anytime you have any of the following symptoms: 1) 3 pound weight gain in 24 hours or 5 pounds in 1 week 2) shortness of breath, with or without a dry hacking cough 3) swelling in the hands, feet or stomach 4) if you have to sleep on extra pillows at night in order to breathe.    Complete by:  As directed      Call MD for:  difficulty breathing, headache or visual disturbances    Complete by:  As directed      Call MD for:  extreme fatigue    Complete by:  As directed      Call MD for:  persistant dizziness or light-headedness    Complete by:  As directed      Diet - low sodium heart healthy    Complete by:  As directed      Increase activity slowly    Complete by:  As directed           Procedures Performed:  Dg Chest 2 View  09/25/2014   CLINICAL DATA:  Shortness of breath. History of COPD and heart disease.  EXAM: CHEST  2 VIEW  COMPARISON:  PA and lateral chest 04/15/2013 and 04/03/2014.  FINDINGS: The patient is status post CABG with a pacing device in place. The chest is hyperexpanded consistent with history of COPD. Trace bilateral pleural effusions are identified. Coarsened appearance of the pulmonary interstitium is chronic. No consolidative process or pneumothorax identified.  IMPRESSION: Trace bilateral pleural effusions, greater on the right.  Emphysema.  Negative for pneumonia or pulmonary edema.   Electronically Signed   By: Inge Rise M.D.   On: 09/25/2014 10:03    2D Echo:  - Left ventricle: The cavity size was normal. Wall thickness was increased in a pattern of mild LVH. The estimated ejection fraction was 60%. - Aortic valve: Sclerosis without stenosis. There was no significant regurgitation. -  Left atrium: The atrium was moderately to severely dilated. - Right ventricle: There is dilitation of the RV. Pacer is in place. Systolic function  was moderately reduced. - Pulmonary arteries: PA peak pressure: 75 mm Hg (S).  Admission HPI: [name] is a 81 year old man with COPD, atrial fibrillation s/p DDD pacemaker, CAD s/p CABG, HTN, HL, OSA who presents with SOB. He says he was in his usual state of health until he woke up overnight with SOB. He says he has it at rest. He denies orthopnea. He denies any cough but his wife notes he usually has a productive cough and corroborates that today he has none. He says he also feels wheezy with some chest tightness but not pain. He has no sick contacts. He denies any fevers, chills, night sweats, palpitations, abdominal pain, nausea, emesis, diarrhea, constipation, dysuria, weakness, numbness.  In the ED, he was found to be sating in the 80s on room air and required 2L nasal cannula. He received solumedrol 125 mg iv once, three duoneb treatments. He says his breathing feels nearly back to baseline with these interventions.  Hospital Course by problem list: Principal Problem:   Acute exacerbation of chronic obstructive pulmonary disease (COPD) Active Problems:   Atrial fibrillation-permanent   Pacemakera dual-chamber Medtronic   CAD  Status post bypass   OSA (obstructive sleep apnea)   Cor pulmonale   #COPD exacerbation: [name]'s SOB and cough was likely due to COPD exacerbation. CXR notes hyperexpansion and he was wheezy on exam in ED but felt much improved after iv steroids and 3 duonebs in the ED with lungs clear on our exam. PNA less likely as CXR without consolidation and afebrile with no leukocytosis. He is a patient of Dr Roy Banks of [facility] pulmonology and was last seen 03/2014 after COPD flare and told to return in 6 months or as needed. Gold stage 2 with last spirometry 2014 with FEV1 58%, post BD 65% and FVC 86%. At home he is on symbicort 2  puff bid, spiriva 18 mcg inh daily. Initially he improved on schedule duonebs, pulmicort but the next day was still feeling dyspneic and had developed a productive cough. Doxycycline was started and he had an additional day of scheduled iv solumedrol before transitioning to oral prednisone the day of discharge. He required oxygen on discharge which was coordinated for his hotel while in town and his new home in [place]. He is scheduled PCP and pulmonology follow-up and says he will come back to [facility] for these appointments. He was discharged with a total five day course of prednisone 40 mg daily and doxycycline 100 mg bid.  #Cor pulmonale: [name] did not return to baseline with treatment for COPD exacerbation. There was concern he could have acute CHF component as his BNP was elevated to >1100 and some LE pitting edema although CXR without pulmonary edema. He was given lasix 20 mg iv once with good urine output. TTE was ordered and demonstrated EF 60% but dilated of RV with systolic function moderately reduced and PA pressure 75 mmHg. He was scheduled out-patient cardiology follow-up and agrees to return to [facility] for this appointment. Cardiology can consider possible R heart catheterization.  #Atrial fibrillation and tachy-brady syndrome: [name] is s/p DDD pacemaker 2009, last interrogated 07/2014. He is followed by Dr Roy Banks of cardiology. Last seen 10/2013 and noted to be doing well with no changes in management at that time. CHADSVasc 4. He is on toprol-xl 25 mg bid for rate control and pradaxa 150 mg bid which were continued.  #CAD: [name] is s/p CABG 2001. He is followed by  Dr Roy Banks of cardiology with no changes made in management at that time. Last seen 10/2013. Normal Myoview 09/2011. At home he is on toprol-xl 25 mg bid, atorvastatin 40 mg daily. His troponins here were negative and EKG without concern for ischemia.  #HTN: [name]'s BP was reasonably controlled on his  home regimen of toprol-xl 25 mg bid, lasix 20 mg daily, atorvastatin 40 mg daily which were continued (note additional dose of lasix 20 mg iv once above).  #OSA: [name] is on CPAP. Sleep study in 2000. He was continued on CPAP but concern this could contribute to elevated PA pressure.  #Thrombocytopenia: Platelets 137 on admission, stable on discharge. Previously 244 03/2013. Etiology unclear. PCP should recheck CBC and consider further work-up.  #HL: Last lipid profile 03/2012 with total cholesterol 127, LDL 64, HDL 44, triglyceride 98. At home he is on atorvastatin 40 mg daily which was continued.  Discharge Vitals:   BP 141/79 mmHg  Pulse 65  Temp(Src) 98.1 F (36.7 C) (Oral)  Resp 22  Ht 5\' 8"  (1.727 m)  Wt 173 lb 11.2 oz (78.79 kg)  BMI 26.42 kg/m2  SpO2 95%  Discharge Physical Exam: Gen: A&O x 3, no acute distress, well developed, well nourished HEENT: Atraumatic, EOMI, sclerae anicteric, moist mucous membranes Heart: Regular rate and rhythm, normal S1 S2, no murmurs, rubs, or gallops, well healed median sternotomy scar Lungs: Clear to auscultation bilaterally, no wheezing, crackles, rhonchi, better air movement today  Abd: Soft, non-tender, non-distended, + bowel sounds, no hepatosplenomegaly Ext: b/l trace pitting edema to shins, cyanotic feet cool to touch but palpable pulses  Discharge Labs:  Basic Metabolic Panel:  Recent Labs Lab 09/26/14 0406 09/27/14 0340  NA 137 137  K 3.7 4.2  CL 105 100*  CO2 23 27  GLUCOSE 163* 161*  BUN 21* 22*  CREATININE 1.08 1.01  CALCIUM 8.8* 9.1  MG  --  2.0   Liver Function Tests:  Recent Labs Lab 09/25/14 0850  AST 43*  ALT 32  ALKPHOS 112  BILITOT 1.2  PROT 6.6  ALBUMIN 3.5   CBC:  Recent Labs Lab 09/25/14 0850 09/26/14 0406 09/27/14 0340  WBC 5.2 5.2 7.3  NEUTROABS 2.9  --   --   HGB 15.3 13.5 15.4  HCT 46.0 41.1 46.4  MCV 95.0 94.5 94.7  PLT 137* 110* 136*   Cardiac Enzymes:  Recent Labs Lab  09/25/14 1916 09/25/14 2216  TROPONINI 0.03 0.03   D-Dimer:  Recent Labs Lab 09/25/14 0850  DDIMER 0.34   BNP 1155  Signed: Kelby Aline, MD 09/29/2014, 10:19 AM    Services Ordered on Discharge: [place] Ordered on Discharge: home O2

## 2014-09-30 ENCOUNTER — Telehealth: Payer: Self-pay | Admitting: Internal Medicine

## 2014-09-30 NOTE — Telephone Encounter (Signed)
New Message        Pt's wife calling stating that pt just got out of the hosp and is unable to get device checked by Dr. Caryl Comes tomorrow and wants to know if he can do a remote transmission instead. Please call back and advise.

## 2014-09-30 NOTE — Telephone Encounter (Signed)
Spoke w/ pt wife and she stated that she was unaware that appt was b/c pt device has reached ERI. She agreed to keep appt.

## 2014-10-01 ENCOUNTER — Encounter: Payer: Medicare HMO | Admitting: Internal Medicine

## 2014-10-01 ENCOUNTER — Encounter: Payer: Self-pay | Admitting: *Deleted

## 2014-10-01 ENCOUNTER — Ambulatory Visit (INDEPENDENT_AMBULATORY_CARE_PROVIDER_SITE_OTHER): Payer: Medicare HMO | Admitting: Internal Medicine

## 2014-10-01 ENCOUNTER — Encounter: Payer: Self-pay | Admitting: Internal Medicine

## 2014-10-01 VITALS — BP 112/70 | HR 65 | Ht 68.5 in | Wt 166.6 lb

## 2014-10-01 DIAGNOSIS — I482 Chronic atrial fibrillation: Secondary | ICD-10-CM | POA: Diagnosis not present

## 2014-10-01 DIAGNOSIS — Z01812 Encounter for preprocedural laboratory examination: Secondary | ICD-10-CM | POA: Diagnosis not present

## 2014-10-01 DIAGNOSIS — I495 Sick sinus syndrome: Secondary | ICD-10-CM

## 2014-10-01 DIAGNOSIS — Z45010 Encounter for checking and testing of cardiac pacemaker pulse generator [battery]: Secondary | ICD-10-CM | POA: Diagnosis not present

## 2014-10-01 DIAGNOSIS — I4821 Permanent atrial fibrillation: Secondary | ICD-10-CM

## 2014-10-01 DIAGNOSIS — Z45018 Encounter for adjustment and management of other part of cardiac pacemaker: Secondary | ICD-10-CM

## 2014-10-01 LAB — BASIC METABOLIC PANEL
BUN: 30 mg/dL — AB (ref 6–23)
CALCIUM: 9.6 mg/dL (ref 8.4–10.5)
CO2: 34 meq/L — AB (ref 19–32)
Chloride: 99 mEq/L (ref 96–112)
Creatinine, Ser: 1 mg/dL (ref 0.40–1.50)
GFR: 76.02 mL/min (ref >60.00)
Glucose, Bld: 120 mg/dL — ABNORMAL HIGH (ref 70–99)
Potassium: 4.1 mEq/L (ref 3.5–5.1)
Sodium: 139 mEq/L (ref 135–145)

## 2014-10-01 LAB — CBC WITH DIFFERENTIAL/PLATELET
Basophils Absolute: 0 10*3/uL (ref 0.0–0.1)
Basophils Relative: 0 % (ref 0.0–3.0)
Eosinophils Absolute: 0 10*3/uL (ref 0.0–0.7)
Eosinophils Relative: 0.4 % (ref 0.0–5.0)
HCT: 48.4 % (ref 39.0–52.0)
Hemoglobin: 16.1 g/dL (ref 13.0–17.0)
LYMPHS ABS: 0.7 10*3/uL (ref 0.7–4.0)
Lymphocytes Relative: 9.3 % — ABNORMAL LOW (ref 12.0–46.0)
MCHC: 33.4 g/dL (ref 30.0–36.0)
MCV: 95.1 fl (ref 78.0–100.0)
Monocytes Absolute: 0.2 10*3/uL (ref 0.1–1.0)
Monocytes Relative: 2.6 % — ABNORMAL LOW (ref 3.0–12.0)
NEUTROS PCT: 87.7 % — AB (ref 43.0–77.0)
Neutro Abs: 6.7 10*3/uL (ref 1.4–7.7)
PLATELETS: 166 10*3/uL (ref 150.0–400.0)
RBC: 5.09 Mil/uL (ref 4.22–5.81)
RDW: 15 % (ref 11.5–15.5)
WBC: 7.6 10*3/uL (ref 4.0–10.5)

## 2014-10-01 LAB — CUP PACEART INCLINIC DEVICE CHECK
Battery Voltage: 2.81 V
Brady Statistic RV Percent Paced: 99.17 %
Lead Channel Impedance Value: 400 Ohm
Lead Channel Setting Pacing Amplitude: 2.5 V
Lead Channel Setting Pacing Pulse Width: 0.4 ms
MDC IDC MSMT LEADCHNL RV IMPEDANCE VALUE: 496 Ohm
MDC IDC MSMT LEADCHNL RV SENSING INTR AMPL: 13.7216
MDC IDC SESS DTM: 20160608093756
MDC IDC SET LEADCHNL RV SENSING SENSITIVITY: 1.5 mV
MDC IDC SET ZONE DETECTION INTERVAL: 400 ms
Zone Setting Detection Interval: 350 ms

## 2014-10-01 NOTE — Patient Instructions (Addendum)
Medication Instructions:  Your physician has recommended you make the following change in your medication: 1) INCREASE Furosemide to 20 mg twice daily for 5 days, then return to normal dosing of 20 mg daily 2) DECREASE Metoprolol to 1/2 tablet (25 mg daily) daily  Labwork: Pre procedure labs today: BMET & CBCD  Testing/Procedures: Your physician has recommended that you have a defibrillator generator change. Please see the instruction sheet given to you today for more information.  Follow-Up: Your wound check is scheduled for 10/23/14 at 10:30 a.m. at [address].    Thank you for choosing [facility]!!

## 2014-10-01 NOTE — Progress Notes (Signed)
Patient Care Team: Leonard Downing, MD as PCP - General (Family Medicine)   HPI  Roy Banks is a 81 y.o. male Seen in followup for pacer implanted 2009 because of tachybradycardia syndrome. He has now persistent-long-term atrial fibrillation and has been anticoagulated with Pradaxa   He has a history of ischemic heart disease with prior bypass surgery Last myoview 2013.undertaken for dyspnea demonstrated no ischemia and ejection fraction 65% dyspnea has been attributed in part to COPD chest x-ray 2009 raised the possibility of interstitial fibrosis.  Echocardiogram 6/16 was reviewed and demonstrated normal LV function significant left atrial enlargement pulmonary hypertension and right ventricular dilatation This was undertaken. Recent hospitalization for shortness of breath. Described COPD exacerbation; however, his BNP was 1150; these records were reviewed  including a chest x-ray was described as negative for pneumonia or pulmonary edema  He has had problems with peripheral edema. It is not so clear to him or his wife that his shortness of breath is worse with his edema.  He continues to complain of symptoms of neuropathy in his feet. There've been no explanation for this.  He is tolerating his Pradaxa without GI symptoms. Renal function 6/16 was normal.   Past Medical History  Diagnosis Date  . HTN (hypertension)   . Hypercholesterolemia   . Sleep apnea   . COPD (chronic obstructive pulmonary disease)   . Coronary artery disease     post CABG x4 in June, 2001  . Tachy-brady syndrome   . Atrial fibrillation     post DDD pacemaker implant  . Peripheral neuropathy     Past Surgical History  Procedure Laterality Date  . Coronary artery bypass graft  10/04/1999    x4 -- LIMA graft to LAD, saphenous vein graft to the diagonal, saphenous vein graft to the first OM vessel, and saphenous vein graft to the distal RCA  . Cardiac catheterization  10/03/1999    Est. EF  of 65% -- Severe left main and two-vessel obstructive atherosclerotic coronary  -- Diffuse ectasia of the right coronary artery -- Normal left ventricular function  . Right knee surgery      Current Outpatient Prescriptions  Medication Sig Dispense Refill  . acetaminophen (TYLENOL) 325 MG tablet Take 650 mg by mouth as needed for mild pain or moderate pain.    Marland Kitchen albuterol (PROAIR HFA) 108 (90 BASE) MCG/ACT inhaler Inhale 2 puffs into the lungs every 4 (four) hours as needed for wheezing or shortness of breath.    Marland Kitchen atorvastatin (LIPITOR) 40 MG tablet TAKE 1 TABLET (40 MG TOTAL) BY MOUTH DAILY. 90 tablet 3  . budesonide-formoterol (SYMBICORT) 160-4.5 MCG/ACT inhaler Inhale 2 puffs into the lungs 2 (two) times daily.    . dabigatran (PRADAXA) 150 MG CAPS capsule TAKE 1 CAPSULE (150 MG TOTAL) BY MOUTH EVERY 12 (TWELVE) HOURS. 60 capsule 5  . doxycycline (VIBRAMYCIN) 100 MG capsule Take 1 capsule (100 mg total) by mouth 2 (two) times daily. One po bid x 4 days (Patient taking differently: Take 100 mg by mouth 2 (two) times daily. ) 8 capsule 0  . ferrous sulfate 325 (65 FE) MG tablet Take 325 mg by mouth daily.      . furosemide (LASIX) 20 MG tablet TAKE 1 TABLET (20 MG TOTAL) BY MOUTH DAILY. 90 tablet 3  . gabapentin (NEURONTIN) 300 MG capsule Take 1 capsule (300 mg total) by mouth at bedtime. 30 capsule 1  . ipratropium (ATROVENT) 0.03 % nasal  spray Place 1 spray into both nostrils at bedtime. 30 mL 2  . metoprolol succinate (TOPROL-XL) 50 MG 24 hr tablet Take 0.5 tablets by mouth 2 (two) times daily.    . Multiple Vitamin (MULTI-VITAMIN PO) Take 1 tablet by mouth daily.     . predniSONE (DELTASONE) 20 MG tablet Take 2 tablets (40 mg total) by mouth daily with breakfast. 10 tablet 0  . ramelteon (ROZEREM) 8 MG tablet Take 1 tablet (8 mg total) by mouth at bedtime as needed for sleep. 30 tablet 1  . tiotropium (SPIRIVA HANDIHALER) 18 MCG inhalation capsule Place 1 capsule (18 mcg total) into inhaler  and inhale daily. 30 capsule 2   No current facility-administered medications for this visit.    No Known Allergies  Review of Systems negative except from HPI and PMH  Physical Exam BP 112/70 mmHg  Pulse 65  Ht 5' 8.5" (1.74 m)  Wt 166 lb 9.6 oz (75.569 kg)  BMI 24.96 kg/m2 Well developed and well nourished in no acute distress HENT normal E scleral and icterus clear Neck Supple; carotids brisk and full Clear to ausculation but decreased breath sounds Device pocket well healed; without hematoma or erythema.  There is no tethering Regular rate and rhythm, no murmurs gallops or rub Soft with active bowel sounds No clubbing cyanosis 2+ Edema Alert and oriented, grossly normal motor and sensory function Skin Warm and Dry  ECG from 6/16  demonstrates atrial fibrillation with ventricular pacing   Assessment and  Plan  Atrial fibrillation-permanent  Pacemaker-Medtronic   The patient's device was interrogated.  The information was reviewed. No changes were made in the programming.    Complete heart block  Ischemic heart disease with prior CABG and normal LV function  HFpEF  He is volume overloaded. This is predominantly right sided although his BNP was significantly elevated. We'll increase his diuretics from 20 daily--twice a day for 5 days.  His device has reached ERI  We have reviewed the benefits and risks of generator replacement.  These include but are not limited to lead fracture and infection.  The patient understands, agrees and is willing to proceed.   Without symptoms of ischemia  He is evolved complete heart block in the setting of his permanent atrial fibrillation. We will decrease his metoprolol dose. There are recent data demonstrating the lack of efficacy of beta blockers in the context of normal LV function not withstanding the presence of coronary disease. I would have a low threshold for discontinuing it altogether      This is a lamp

## 2014-10-03 ENCOUNTER — Encounter: Payer: Self-pay | Admitting: Internal Medicine

## 2014-10-08 ENCOUNTER — Ambulatory Visit (HOSPITAL_COMMUNITY)
Admission: RE | Admit: 2014-10-08 | Discharge: 2014-10-08 | Disposition: A | Discharge status: Home / Self Care | Payer: Medicare HMO | Source: Ambulatory Visit | Attending: Internal Medicine | Admitting: Internal Medicine

## 2014-10-08 ENCOUNTER — Encounter (HOSPITAL_COMMUNITY)
Admission: RE | Disposition: A | Discharge status: Home / Self Care | Payer: Medicare HMO | Source: Ambulatory Visit | Attending: Internal Medicine

## 2014-10-08 DIAGNOSIS — Z01812 Encounter for preprocedural laboratory examination: Secondary | ICD-10-CM

## 2014-10-08 DIAGNOSIS — Z45010 Encounter for checking and testing of cardiac pacemaker pulse generator [battery]: Secondary | ICD-10-CM

## 2014-10-08 DIAGNOSIS — Z950 Presence of cardiac pacemaker: Secondary | ICD-10-CM | POA: Diagnosis present

## 2014-10-08 DIAGNOSIS — I495 Sick sinus syndrome: Secondary | ICD-10-CM

## 2014-10-08 DIAGNOSIS — Z45018 Encounter for adjustment and management of other part of cardiac pacemaker: Secondary | ICD-10-CM

## 2014-10-08 DIAGNOSIS — I482 Chronic atrial fibrillation: Secondary | ICD-10-CM | POA: Diagnosis not present

## 2014-10-08 DIAGNOSIS — R001 Bradycardia, unspecified: Secondary | ICD-10-CM | POA: Diagnosis not present

## 2014-10-08 DIAGNOSIS — I4821 Permanent atrial fibrillation: Secondary | ICD-10-CM

## 2014-10-08 HISTORY — PX: EP IMPLANTABLE DEVICE: SHX172B

## 2014-10-08 LAB — SURGICAL PCR SCREEN
MRSA, PCR: NEGATIVE
Staphylococcus aureus: NEGATIVE

## 2014-10-08 SURGERY — PPM/BIV PPM GENERATOR CHANGEOUT

## 2014-10-08 MED ORDER — MUPIROCIN 2 % EX OINT
TOPICAL_OINTMENT | CUTANEOUS | Status: AC
  Filled 2014-10-08: qty 22

## 2014-10-08 MED ORDER — CEFAZOLIN SODIUM-DEXTROSE 2-3 GM-% IV SOLR
INTRAVENOUS | Status: AC
  Filled 2014-10-08: qty 50

## 2014-10-08 MED ORDER — SODIUM CHLORIDE 0.9 % IV SOLN: INTRAVENOUS | Status: DC

## 2014-10-08 MED ORDER — FENTANYL CITRATE (PF) 100 MCG/2ML IJ SOLN
INTRAMUSCULAR | Status: AC
  Filled 2014-10-08: qty 2

## 2014-10-08 MED ORDER — MIDAZOLAM HCL 5 MG/5ML IJ SOLN
INTRAMUSCULAR | Status: AC
  Filled 2014-10-08: qty 5

## 2014-10-08 MED ORDER — CEFAZOLIN SODIUM-DEXTROSE 2-3 GM-% IV SOLR
INTRAVENOUS | Status: DC | PRN
  Administered 2014-10-08: 2 g via INTRAVENOUS

## 2014-10-08 MED ORDER — SODIUM CHLORIDE 0.9 % IV SOLN
INTRAVENOUS | Status: DC
  Administered 2014-10-08: 11:00:00 via INTRAVENOUS

## 2014-10-08 MED ORDER — MIDAZOLAM HCL 5 MG/5ML IJ SOLN
INTRAMUSCULAR | Status: DC | PRN
  Administered 2014-10-08 (×2): 1 mg via INTRAVENOUS

## 2014-10-08 MED ORDER — LIDOCAINE HCL (PF) 1 % IJ SOLN
INTRAMUSCULAR | Status: AC
  Filled 2014-10-08: qty 30

## 2014-10-08 MED ORDER — SODIUM CHLORIDE 0.9 % IR SOLN
80.0000 mg | Status: AC
  Administered 2014-10-08: 80 mg

## 2014-10-08 MED ORDER — CHLORHEXIDINE GLUCONATE 4 % EX LIQD
60.0000 mL | Freq: Once | CUTANEOUS | Status: DC
Start: 2014-10-08 — End: 2014-10-08

## 2014-10-08 MED ORDER — SODIUM CHLORIDE 0.9 % IR SOLN
Status: AC
  Filled 2014-10-08: qty 2

## 2014-10-08 MED ORDER — CEFAZOLIN SODIUM-DEXTROSE 2-3 GM-% IV SOLR: 2.0000 g | INTRAVENOUS | Status: DC

## 2014-10-08 MED ORDER — ACETAMINOPHEN 325 MG PO TABS: 325.0000 mg | ORAL_TABLET | ORAL | Status: DC | PRN

## 2014-10-08 MED ORDER — MUPIROCIN 2 % EX OINT
1.0000 "application " | TOPICAL_OINTMENT | Freq: Once | CUTANEOUS | Status: AC
  Administered 2014-10-08: 1 via TOPICAL

## 2014-10-08 MED ORDER — ONDANSETRON HCL 4 MG/2ML IJ SOLN: 4.0000 mg | Freq: Four times a day (QID) | INTRAMUSCULAR | Status: DC | PRN

## 2014-10-08 MED ORDER — LIDOCAINE HCL (PF) 1 % IJ SOLN
INTRAMUSCULAR | Status: DC | PRN
  Administered 2014-10-08: 40 mL

## 2014-10-08 MED ORDER — FENTANYL CITRATE (PF) 100 MCG/2ML IJ SOLN
INTRAMUSCULAR | Status: DC | PRN
  Administered 2014-10-08: 25 ug via INTRAVENOUS

## 2014-10-08 SURGICAL SUPPLY — 5 items
CABLE SURGICAL S-101-97-12 (CABLE) ×3 IMPLANT
PACEMAKER ADAPTA DR ADDRL1 (Pacemaker) ×1 IMPLANT
PAD DEFIB LIFELINK (PAD) ×3 IMPLANT
PPM ADAPTA DR ADDRL1 (Pacemaker) ×3 IMPLANT
TRAY PACEMAKER INSERTION (CUSTOM PROCEDURE TRAY) ×3 IMPLANT

## 2014-10-08 NOTE — Discharge Instructions (Signed)
Pacemaker Battery Change, Care After °Refer to this sheet in the next few weeks. These instructions provide you with information on caring for yourself after your procedure. Your health care provider may also give you more specific instructions. Your treatment has been planned according to current medical practices, but problems sometimes occur. Call your health care provider if you have any problems or questions after your procedure. °WHAT TO EXPECT AFTER THE PROCEDURE °After your procedure, it is typical to have the following sensations: °· Soreness at the pacemaker site. °HOME CARE INSTRUCTIONS  °· Keep the incision clean and dry. °· Unless advised otherwise, you may shower beginning 48 hours after your procedure. °· For the first week after the replacement, avoid stretching motions that pull at the incision site, and avoid heavy exercise with the arm that is on the same side as the incision. °· Take medicines only as directed by your health care provider. °· Keep all follow-up visits as directed by your health care provider. °SEEK MEDICAL CARE IF:  °· You have pain at the incision site that is not relieved by over-the-counter or prescription medicine. °· There is drainage or pus from the incision site. °· There is swelling larger than a lime at the incision site. °· You develop red streaking that extends above or below the incision site. °· You feel brief, intermittent palpitations, light-headedness, or any symptoms that you feel might be related to your heart. °SEEK IMMEDIATE MEDICAL CARE IF:  °· You experience chest pain that is different than the pain at the pacemaker site. °· You experience shortness of breath. °· You have palpitations or irregular heartbeat. °· You have light-headedness that does not go away quickly. °· You faint. °· You have pain that gets worse and is not relieved by medicine. °Document Released: 01/30/2013 Document Revised: 08/26/2013 Document Reviewed: 01/30/2013 °[employer]® Patient  Information ©2015 [employer]. This information is not intended to replace advice given to you by your health care provider. Make sure you discuss any questions you have with your health care provider. ° °

## 2014-10-09 ENCOUNTER — Encounter (HOSPITAL_COMMUNITY): Payer: Self-pay | Admitting: Internal Medicine

## 2014-10-09 MED FILL — Lidocaine HCl Local Preservative Free (PF) Inj 1%: INTRAMUSCULAR | Qty: 60 | Status: AC

## 2014-10-10 ENCOUNTER — Encounter: Payer: Self-pay | Admitting: Pulmonary Disease

## 2014-10-10 ENCOUNTER — Ambulatory Visit (INDEPENDENT_AMBULATORY_CARE_PROVIDER_SITE_OTHER): Payer: Medicare HMO | Admitting: Pulmonary Disease

## 2014-10-10 VITALS — BP 108/60 | HR 71 | Temp 97.1°F | Wt 163.4 lb

## 2014-10-10 DIAGNOSIS — I2781 Cor pulmonale (chronic): Secondary | ICD-10-CM | POA: Diagnosis not present

## 2014-10-10 DIAGNOSIS — I2510 Atherosclerotic heart disease of native coronary artery without angina pectoris: Secondary | ICD-10-CM

## 2014-10-10 DIAGNOSIS — G4733 Obstructive sleep apnea (adult) (pediatric): Secondary | ICD-10-CM | POA: Diagnosis not present

## 2014-10-10 DIAGNOSIS — J441 Chronic obstructive pulmonary disease with (acute) exacerbation: Secondary | ICD-10-CM

## 2014-10-10 DIAGNOSIS — I4819 Other persistent atrial fibrillation: Secondary | ICD-10-CM

## 2014-10-10 DIAGNOSIS — I481 Persistent atrial fibrillation: Secondary | ICD-10-CM

## 2014-10-10 DIAGNOSIS — Z950 Presence of cardiac pacemaker: Secondary | ICD-10-CM

## 2014-10-10 NOTE — Patient Instructions (Signed)
Today we updated your med list in our EPIC system...    Continue your current medications the same...  Continue the SYMBICORT twice daily... Continue the [facility] daily... Use the PROAIR-HFA as needed for wheezing... Use your OXYGEN at 2L/min flow regularly 24/7... Continue your CPAP at night...  We will try to get a "download" of your CPAP machine from [facility]  Stay as active as poss, & you need to do daily exercise to build up your breath & stamina...  Call for any questions...  Let's plan a follow up visit in 4-6 weeks, sooner if needed for acute problems.Marland KitchenMarland Kitchen

## 2014-10-10 NOTE — Progress Notes (Addendum)
Subjective:     Patient ID: Roy Banks, male   DOB: 22-Dec-1930, 81 y.o.   MRN: [id number]  HPI 81 y/o WM, referred by [facility] for on-going pulmonary evaluation & treatment> his PCP is Dr. Claris Gower; medical hx includes DM w/ neuropathy...      He was prev seen by DrWert, last 12/14> former smoker, quit 1984, followed for COPD w/ CTChest 2014 showing centrilobular emphysema & prev PFTs w/ mod airflow obstruction;  He's had cough, beige sput, wheezing, DOE and treated w/ Symbicort160-2spBid, Spiriva daily, ProventilHFA as needed... He was referred to pulmonary rehab but he never participated... He receives occas Pred tapers during acute exac;        Followed by Kallie Edward for OSA, last 6/15> on CPAP x37yrs, good compliance, uses Klonopin for RLS;        ADM by [facility] 6/2 - 09/27/14 w/ hypoxemia & COPD exac, incr SOB but denied cough/ sput/ hemoptysis; found to have right heart failure/ PulmHTN;  Treated w/ Pred, Doxy, Oxygen started this adm, & continuation of Symbicort160, Spiriva, Proair;  He was diuresed w/ Lasix & asked to f/u w/ Cards to consider right heart cath...       Cardiology eval by DrKlein, seen 10/07/14>  Pacer implanted 2009 for tachybrady syndrome, persistent AFib on Pradaxa, hx CAD- s/pCABG, CHF w/ BNP=1150 & pedal edema; Myoview 2013 was neg for ischemia w/ EF=65%;  2DEcho 6/16 showed mildLVH, norm LVF w/ EF=60%, AVsclerosis w/o stenosis, mod to severe LAdil, RVdil w/ mod reduction in RV function & est PAsys~65mmHg... Pacer generator changed 10/08/14 by DrKlein...   EXAM showed Afeb, VSS, O2sat=91% on O2 at 2L/min;  HEENT- neg, mallampati2;  Chest- decr BS but w/o w/r/r;  Heart- pacer, RR, gr1/6SEM w/o r/g;  Abd- soft, neg;  Ext- trace edema w/o c/c...  CTChest 08/28/12 showed borderline heart size, no signif adenopathy, modHH, atherosclerosis in Ao, coronaries, & s/pCABG; mod to severe centrilobular emphysema, mild biapical pleuroparenchymal thickening, no other lung lesions;  large right renal cyst...   CXR 09/25/14 showed s/pCABG w/ pacer in place, trace bilat effus, hyperinflated w/ incr markings, NAD...   2DEcho 6/16 showed mildLVH, norm LVF w/ EF=60%, AVsclerosis w/o stenosis, mod to severe LAdil, RVdil w/ mod reduction in RV function & est PAsys~4mmHg  LABS 6/16 showed elevBS, BNP=1150    Ambulatory oxygen saturation test> On 2L/min his O2sat=98% w/ pulse=61/min;  Walked 1 lap w/ O2sat=91% w/ pulse=76/min... IMP/PLAN>>   Jeffrie has COPD/emphysema on Symbicort, Spiriva, ProairHFA (he finished prev Doxy, Pred);  OSA on CPAP & we will contact [employer] for machine download data;  CorPulmonale w/ right heart failure and pulm art HTN=> on O2 at 2L/min 24/7 now & continue Lasix20...   ADDENDUM>> CPAP download date from [employer]> 5/6 - 09/27/14=> CPAP=7, 28/30 days, 7-8h per night, AHI=2.0; good compliance & effectiveness confirmed...    Past Medical History  Diagnosis Date  . HTN (hypertension) >> on Metoprolol50-1/2Bid, Lasix20   . Hypercholesterolemia >> on Lipitor40   . Sleep apnea >> on CPAP followed by DrAlva   . COPD (chronic obstructive pulmonary disease)   . Coronary artery disease >> followed by DrJordan     post CABG x4 in June, 2001  . Tachy-brady syndrome   . Atrial fibrillation >> on Pradaxa 150Bid     post DDD pacemaker implant >> followed by DrKlein  . Peripheral neuropathy >> on Neurontin 300Qhs, Klonopin     Past Surgical History  Procedure Laterality Date  .  Coronary artery bypass graft  10/04/1999    x4 -- LIMA graft to LAD, saphenous vein graft to the diagonal, saphenous vein graft to the first OM vessel, and saphenous vein graft to the distal RCA  . Cardiac catheterization  10/03/1999    Est. EF of 65% -- Severe left main and two-vessel obstructive atherosclerotic coronary  -- Diffuse ectasia of the right coronary artery -- Normal left ventricular function  . Right knee surgery    . Ep implantable device N/A 10/08/2014    Procedure:  PPM  Generator Changeout;  Surgeon: Deboraha Sprang, MD;  Location: [facility];  Service: Cardiovascular;  Laterality: N/A;    Outpatient Encounter Prescriptions as of 10/10/2014  Medication Sig  . acetaminophen (TYLENOL) 325 MG tablet Take 650 mg by mouth as needed for mild pain or moderate pain.  Marland Kitchen albuterol (PROAIR HFA) 108 (90 BASE) MCG/ACT inhaler Inhale 2 puffs into the lungs every 4 (four) hours as needed for wheezing or shortness of breath.  Marland Kitchen atorvastatin (LIPITOR) 40 MG tablet TAKE 1 TABLET (40 MG TOTAL) BY MOUTH DAILY.  . budesonide-formoterol (SYMBICORT) 160-4.5 MCG/ACT inhaler Inhale 2 puffs into the lungs 2 (two) times daily.  . dabigatran (PRADAXA) 150 MG CAPS capsule TAKE 1 CAPSULE (150 MG TOTAL) BY MOUTH EVERY 12 (TWELVE) HOURS.  . ferrous sulfate 325 (65 FE) MG tablet Take 325 mg by mouth daily.    . furosemide (LASIX) 20 MG tablet TAKE 1 TABLET (20 MG TOTAL) BY MOUTH DAILY.  Marland Kitchen gabapentin (NEURONTIN) 300 MG capsule Take 1 capsule (300 mg total) by mouth at bedtime.  Marland Kitchen ipratropium (ATROVENT) 0.03 % nasal spray Place 1 spray into both nostrils at bedtime.  . metoprolol succinate (TOPROL-XL) 50 MG 24 hr tablet Take 25 mg by mouth daily.   . Multiple Vitamin (MULTI-VITAMIN PO) Take 1 tablet by mouth daily.   . ramelteon (ROZEREM) 8 MG tablet Take 1 tablet (8 mg total) by mouth at bedtime as needed for sleep.  Marland Kitchen tiotropium (SPIRIVA HANDIHALER) 18 MCG inhalation capsule Place 1 capsule (18 mcg total) into inhaler and inhale daily.  . [DISCONTINUED] doxycycline (VIBRAMYCIN) 100 MG capsule Take 1 capsule (100 mg total) by mouth 2 (two) times daily. One po bid x 4 days (Patient taking differently: Take 100 mg by mouth 2 (two) times daily. )  . [DISCONTINUED] predniSONE (DELTASONE) 20 MG tablet Take 2 tablets (40 mg total) by mouth daily with breakfast.   No facility-administered encounter medications on file as of 10/10/2014.    No Known Allergies   Immunization History   Administered Date(s) Administered  . Influenza Split 01/23/2013  . Influenza,inj,Quad PF,36+ Mos 04/03/2014    Family History  Problem Relation Age of Onset  . Heart attack Father   . Heart attack Mother 49    and has had previous angioplasty    History   Social History  . Marital Status: Married    Spouse Name: N/A  . Number of Children: 2  . Years of Education: N/A   Occupational History  . small Arts development officer    Social History Main Topics  . Smoking status: Former Smoker -- 1.00 packs/day for 32 years    Types: Cigarettes    Quit date: 11/26/1982  . Smokeless tobacco: Former Systems developer    Types: [place] date: 11/26/2002     Comment: quit smoking 31 yrs ago  . Alcohol Use: No  . Drug Use: No  . Sexual  Activity: Not on file   Other Topics Concern  . Not on file   Social History Narrative    Current Medications, Allergies, Past Medical History, Past Surgical History, Family History, and Social History were reviewed in [employer] record.   Review of Systems            All symptoms NEG except where BOLDED >>  Constitutional:  F/C/S, fatigue, anorexia, unexpected weight change. HEENT:  HA, visual changes, hearing loss, earache, nasal symptoms, sore throat, mouth sores, hoarseness. Resp:  cough, sputum, hemoptysis; SOB, tightness, wheezing. Cardio:  CP, palpit, DOE, orthopnea, edema. GI:  N/V/D/C, blood in stool; reflux, abd pain, distention, gas. GU:  dysuria, freq, urgency, hematuria, flank pain, voiding difficulty. MS:  joint pain, swelling, tenderness, decr ROM; neck pain, back pain, etc. Neuro:  HA, tremors, seizures, dizziness, syncope, weakness, numbness, gait abn. Skin:  suspicious lesions or skin rash. Heme:  adenopathy, bruising, bleeding. Psyche:  confusion, agitation, sleep disturbance, hallucinations, anxiety, depression suicidal.   Objective:   Physical Exam      Vital Signs:  Reviewed...  General:  WD, WN, 81 y/o WM  in NAD; alert & oriented; pleasant & cooperative... HEENT:  [place]/AT; Conjunctiva- pink, Sclera- nonicteric, EOM-wnl, PERRLA, EACs-clear, TMs-wnl; NOSE-clear; THROAT-clear & wnl. Neck:  Supple w/ fair ROM; no JVD; normal carotid impulses w/o bruits; no thyromegaly or nodules palpated; no lymphadenopathy. Chest:  decr BS bilat; without wheezes, rales, or rhonchi heard. Heart:  Regular Rhythm; pacer, without murmurs, rubs, or gallops detected. Abdomen:  Soft & nontender- no guarding or rebound; normal bowel sounds; no organomegaly or masses palpated. Ext:  Normal ROM; without deformities or arthritic changes; no varicose veins, venous insuffic, or edema;  Pulses intact w/o bruits. Neuro:  No focal neuro deficits; gait normal & balance OK. Derm:  No lesions noted; no rash etc. Lymph:  No cervical, supraclavicular, axillary, or inguinal adenopathy palpated.   Assessment:      IMP >> COPD/emphysema OSA on CPAP Right heart failure and pulmonary HTN CAD- s/p CABG Hx tachy-brady w/ pacer AFib DM w/ neuropathy Hiatus hernia Renal cyst  PLAN >>  Kiano has COPD/emphysema on Symbicort, Spiriva, ProairHFA (he finished prev Doxy, Pred);  OSA on CPAP & we will contact [employer] for machine download data;  CorPulmonale w/ right heart failure and pulm art HTN=> on O2 at 2L/min 24/7 now & continue Lasix20...     Plan:     Patient's Medications  New Prescriptions                                                               OXYGEN 2L/min by nasal cannula...  Previous Medications   ACETAMINOPHEN (TYLENOL) 325 MG TABLET    Take 650 mg by mouth as needed for mild pain or moderate pain.   ALBUTEROL (PROAIR HFA) 108 (90 BASE) MCG/ACT INHALER    Inhale 2 puffs into the lungs every 4 (four) hours as needed for wheezing or shortness of breath.   ATORVASTATIN (LIPITOR) 40 MG TABLET    TAKE 1 TABLET (40 MG TOTAL) BY MOUTH DAILY.   BUDESONIDE-FORMOTEROL (SYMBICORT) 160-4.5 MCG/ACT INHALER    Inhale 2 puffs into  the lungs 2 (two) times daily.   DABIGATRAN (PRADAXA) 150 MG CAPS CAPSULE  TAKE 1 CAPSULE (150 MG TOTAL) BY MOUTH EVERY 12 (TWELVE) HOURS.   FERROUS SULFATE 325 (65 FE) MG TABLET    Take 325 mg by mouth daily.     FUROSEMIDE (LASIX) 20 MG TABLET    TAKE 1 TABLET (20 MG TOTAL) BY MOUTH DAILY.   GABAPENTIN (NEURONTIN) 300 MG CAPSULE    Take 1 capsule (300 mg total) by mouth at bedtime.   IPRATROPIUM (ATROVENT) 0.03 % NASAL SPRAY    Place 1 spray into both nostrils at bedtime.   METOPROLOL SUCCINATE (TOPROL-XL) 50 MG 24 HR TABLET    Take 25 mg by mouth daily.    MULTIPLE VITAMIN (MULTI-VITAMIN PO)    Take 1 tablet by mouth daily.    RAMELTEON (ROZEREM) 8 MG TABLET    Take 1 tablet (8 mg total) by mouth at bedtime as needed for sleep.   TIOTROPIUM (SPIRIVA HANDIHALER) 18 MCG INHALATION CAPSULE    Place 1 capsule (18 mcg total) into inhaler and inhale daily.  Modified Medications   No medications on file  Discontinued Medications   DOXYCYCLINE (VIBRAMYCIN) 100 MG CAPSULE    Take 1 capsule (100 mg total) by mouth 2 (two) times daily. One po bid x 4 days   PREDNISONE (DELTASONE) 20 MG TABLET    Take 2 tablets (40 mg total) by mouth daily with breakfast.

## 2014-10-22 ENCOUNTER — Encounter: Payer: Self-pay | Admitting: Internal Medicine

## 2014-10-23 ENCOUNTER — Encounter: Payer: Medicare HMO | Admitting: Physician Assistant

## 2014-10-23 ENCOUNTER — Ambulatory Visit: Payer: Medicare HMO | Admitting: Pulmonary Disease

## 2014-10-23 ENCOUNTER — Ambulatory Visit (INDEPENDENT_AMBULATORY_CARE_PROVIDER_SITE_OTHER): Payer: Medicare HMO | Admitting: *Deleted

## 2014-10-23 DIAGNOSIS — I482 Chronic atrial fibrillation, unspecified: Secondary | ICD-10-CM

## 2014-10-23 DIAGNOSIS — R001 Bradycardia, unspecified: Secondary | ICD-10-CM | POA: Diagnosis not present

## 2014-10-23 LAB — CUP PACEART INCLINIC DEVICE CHECK
Battery Impedance: 100 Ohm
Lead Channel Impedance Value: 628 Ohm
Lead Channel Pacing Threshold Amplitude: 0.75 V
Lead Channel Setting Pacing Pulse Width: 0.4 ms
Lead Channel Setting Sensing Sensitivity: 2.8 mV
MDC IDC MSMT BATTERY REMAINING LONGEVITY: 170 mo
MDC IDC MSMT BATTERY VOLTAGE: 2.8 V
MDC IDC MSMT LEADCHNL RA IMPEDANCE VALUE: 67 Ohm
MDC IDC MSMT LEADCHNL RV PACING THRESHOLD PULSEWIDTH: 0.4 ms
MDC IDC SESS DTM: 20160630132328
MDC IDC SET LEADCHNL RV PACING AMPLITUDE: 2.5 V
MDC IDC STAT BRADY RV PERCENT PACED: 100 %

## 2014-10-23 NOTE — Progress Notes (Signed)
Wound check appointment. Wound without redness or edema. Incision edges approximated, wound well healed. Normal device function. Threshold and impedance consistent with implant measurements. Device programmed at appropriate safety margin. Histogram distribution appropriate for patient and level of activity. No high ventricular rates noted. Patient educated about wound care, arm mobility, lifting restrictions. ROV in 3 months with SK.

## 2014-10-29 ENCOUNTER — Other Ambulatory Visit: Payer: Self-pay | Admitting: *Deleted

## 2014-10-29 ENCOUNTER — Telehealth: Payer: Self-pay | Admitting: Adult Health

## 2014-10-29 MED ORDER — TIOTROPIUM BROMIDE MONOHYDRATE 18 MCG IN CAPS: 18.0000 ug | ORAL_CAPSULE | Freq: Every day | RESPIRATORY_TRACT | Status: DC

## 2014-10-29 MED ORDER — ALBUTEROL SULFATE HFA 108 (90 BASE) MCG/ACT IN AERS: 2.0000 | INHALATION_SPRAY | RESPIRATORY_TRACT | Status: DC | PRN

## 2014-10-29 NOTE — Telephone Encounter (Signed)
Called Rebecca at [facility]  Needed Spiriva called in. Patient is currently on vacation and does not have his Spiriva with him.  Called in 30 day supply to last until Vacation over. Nothing further needed.

## 2014-11-12 ENCOUNTER — Ambulatory Visit (INDEPENDENT_AMBULATORY_CARE_PROVIDER_SITE_OTHER): Payer: Medicare Other | Admitting: Pulmonary Disease

## 2014-11-12 VITALS — BP 112/68 | HR 84 | Temp 98.1°F | Wt 165.4 lb

## 2014-11-12 DIAGNOSIS — I2781 Cor pulmonale (chronic): Secondary | ICD-10-CM

## 2014-11-12 DIAGNOSIS — I4819 Other persistent atrial fibrillation: Secondary | ICD-10-CM

## 2014-11-12 DIAGNOSIS — Z950 Presence of cardiac pacemaker: Secondary | ICD-10-CM

## 2014-11-12 DIAGNOSIS — G4733 Obstructive sleep apnea (adult) (pediatric): Secondary | ICD-10-CM

## 2014-11-12 DIAGNOSIS — I2510 Atherosclerotic heart disease of native coronary artery without angina pectoris: Secondary | ICD-10-CM

## 2014-11-12 DIAGNOSIS — I481 Persistent atrial fibrillation: Secondary | ICD-10-CM

## 2014-11-12 DIAGNOSIS — J418 Mixed simple and mucopurulent chronic bronchitis: Secondary | ICD-10-CM | POA: Diagnosis not present

## 2014-11-12 MED ORDER — GABAPENTIN 300 MG PO CAPS: 300.0000 mg | ORAL_CAPSULE | Freq: Every day | ORAL | Status: DC

## 2014-11-12 MED ORDER — FUROSEMIDE 20 MG PO TABS: ORAL_TABLET | ORAL | Status: DC

## 2014-11-12 NOTE — Patient Instructions (Signed)
Today we updated your med list in our EPIC system...    Continue your current medications the same...    We refilled your Lasix per request...  We are going to set up an overnight oximetry test- on CPAP, on Oxygen...    To be sure your oxygen level is staying in the 90's while you are asleep...  We will arrange for the PULSE-dose device and inquire about a portable oxygen concentrator for you...  Call for any questions...  Let's plan a follow up visit in 43mo, call sooner for any problems.Marland KitchenMarland Kitchen

## 2014-11-13 ENCOUNTER — Telehealth: Payer: Self-pay | Admitting: Cardiology

## 2014-11-13 ENCOUNTER — Encounter: Payer: Self-pay | Admitting: Pulmonary Disease

## 2014-11-13 ENCOUNTER — Other Ambulatory Visit: Payer: Self-pay

## 2014-11-13 MED ORDER — DABIGATRAN ETEXILATE MESYLATE 150 MG PO CAPS: ORAL_CAPSULE | ORAL | Status: DC

## 2014-11-13 NOTE — Telephone Encounter (Signed)
°  1. Which medications need to be refilled? Pradaxa-new prescription  2. Which pharmacy is medication to be sent to?[phone]  3. Do they need a 30 day or 90 day supply? 90 and refills  4. Would they like a call back once the medication has been sent to the pharmacy? yes

## 2014-11-13 NOTE — Progress Notes (Addendum)
Subjective:     Patient ID: Roy Banks, male   DOB: 02-Apr-1931, 81 y.o.   MRN: [id number]  HPI ~  October 10, 2014:  Initial pulmonary consult by SN>  56 y/o WM, referred by [facility] for on-going pulmonary evaluation & treatment> his PCP is Dr. Claris Gower; medical hx includes DM w/ neuropathy...      He was prev seen by DrWert, last 12/14> former smoker, quit 1984, followed for COPD w/ CTChest 2014 showing centrilobular emphysema & prev PFTs w/ mod airflow obstruction;  He's had cough, beige sput, wheezing, DOE and treated w/ Symbicort160-2spBid, Spiriva daily, ProventilHFA as needed... He was referred to pulmonary rehab but he never participated... He receives occas Pred tapers during acute exac;        Followed by Kallie Edward for OSA, last 6/15> on CPAP x22yrs, good compliance, uses Klonopin for RLS;        ADM by [facility] 6/2 - 09/27/14 w/ hypoxemia & COPD exac, incr SOB but denied cough/ sput/ hemoptysis; found to have right heart failure/ PulmHTN;  Treated w/ Pred, Doxy, Oxygen started this adm, & continuation of Symbicort160, Spiriva, Proair;  He was diuresed w/ Lasix & asked to f/u w/ Cards to consider right heart cath...       Cardiology eval by DrKlein, seen 10/07/14>  Pacer implanted 2009 for tachybrady syndrome, persistent AFib on Pradaxa, hx CAD- s/pCABG, CHF w/ BNP=1150 & pedal edema; Myoview 2013 was neg for ischemia w/ EF=65%;  2DEcho 6/16 showed mildLVH, norm LVF w/ EF=60%, AVsclerosis w/o stenosis, mod to severe LAdil, RVdil w/ mod reduction in RV function & est PAsys~49mmHg... Pacer generator changed 10/08/14 by DrKlein...   EXAM showed Afeb, VSS, O2sat=91% on O2 at 2L/min;  HEENT- neg, mallampati2;  Chest- decr BS but w/o w/r/r;  Heart- pacer, RR, gr1/6SEM w/o r/g;  Abd- soft, neg;  Ext- trace edema w/o c/c...  Sleep study 12/[facility] at [facility] reported mild OSA w/ RDI=9/hr, loud snoring, PLMS=5.5/hr, & lowest O2sat=91% on RA... Started on CPAP- details unknown, & Klonopin for  RLS...  CTChest 08/28/12 showed borderline heart size, no signif adenopathy, modHH, atherosclerosis in Ao, coronaries, & s/pCABG; mod to severe centrilobular emphysema, mild biapical pleuroparenchymal thickening, no other lung lesions; large right renal cyst...   CXR 09/25/14 showed s/pCABG w/ pacer in place, trace bilat effus, hyperinflated w/ incr markings, NAD...   2DEcho 6/16 showed mildLVH, norm LVF w/ EF=60%, AVsclerosis w/o stenosis, mod to severe LAdil, RVdil w/ mod reduction in RV function & est PAsys~21mmHg  LABS 6/16 showed elevBS, BNP=1150  Ambulatory oxygen saturation test> On 2L/min his O2sat=98% w/ pulse=61/min;  Walked 1 lap w/ O2sat=91% w/ pulse=76/min... IMP/PLAN>>   Roy Banks has COPD/emphysema on Symbicort, Spiriva, ProairHFA (he finished prev Doxy, Pred);  OSA on CPAP & we will contact [employer] for machine download data;  CorPulmonale w/ right heart failure and pulm art HTN=> on O2 at 2L/min 24/7 now & continue Lasix20...  ADDENDUM>> CPAP download date from [employer]> 5/6 - 09/27/14=> CPAP=7, 28/30 days, 7-8h per night, AHI=2.0; good compliance & effectiveness confirmed...   ~  November 12, 2014:  17mo OV w/ SN>   Roy Banks has moved to [place] he tells me that he still prefers to get his medical care here in [place];  His breathing is OK on his O2 at 2L/min 24/7 via Huey Romans- it is bled into his CPAP at night & using bottles during the day- he needs pulse dose set up & may be better  served w/ POC so we will inquire on his behalf...     COPD/emphysema> on Symbicort160-2spBid, Spiriva daily, ProairHFA prn (off prev Doxy, Pred); CT 2014 w/ mod to severe centrilob emphysema    OSA on CPAP> download from [place] 5/16 showed CPAP=7 (he says 5), good compliance & effectiveness confirmed w/ AHI=2...    Right heart failure and pulmonary HTN> 2DEcho 6/16 w/ RVdil & mod reduced RV function & PAsys est~4mmHg; started on O2 and diuresed w/ Lasix20; DrKlein incr to Lasix20Bid transiently...    CAD- s/p CABG>  followed by DrJordan for Cards on MetopER50-1/2, Lasix20/d; last OV 10/2013 & due for yearly check up soon...    AFib,Hx tachy-brady w/ pacer> on Pradaxa & followed by DrKlein; generator changed 10/08/14 & doing satis, he does tele-pacer checks monthly...    DM w/ neuropathy> on diet alone for his DM (BS in hosp 120-160, no A1c in Epic); on Neurontin300Qhs...     Hiatus hernia> aware, not currently on meds, denies reflux, heartburn, dysphagia, n/v, d/c, blood seen...       Renal cyst EXAM shows Afeb, VSS, O2sat=92% on @l /min at rest;  HEENT- neg, mallampati2;  Chest- decr BS but w/o w/r/r;  Heart- pacer, RR, gr1/6SEM w/o r/g;  Abd- soft, neg;  Ext- trace edema w/o c/c... IMP/PLAN>>  No labs done today, he has upcoming appt w/ DrJordan, Lasix20 refilled & reminded NO SALT, we will arrange for ONO on CPAP/ on O2 to confirm no hypoxemia at night, needs pulse dose set up & he would benefit from POC;  Plan ROV 57mo, sooner prn... ADDENDUM>>  ONO done 12/20/14 on CPAP & on O2 at 2L/min showed resolution of the nocturnal hypoxemia- mean O2sat=94.7%, time <88% was 68min36sec (0.4% of the recording) and he was >90% for 99.5%; continue same.    Past Medical History  Diagnosis Date  . HTN (hypertension) >> on MetoprololER50-1/2Qd, Lasix20   . Hypercholesterolemia >> on Lipitor40   . Sleep apnea >> on CPAP followed by DrAlva   . COPD (chronic obstructive pulmonary disease) w/ CorPulmonale- O2 started 09/2014 on 2L/min    . Coronary artery disease >> followed by DrJordan     post CABG x4 in June, 2001  . Tachy-brady syndrome   . Atrial fibrillation >> on Pradaxa 150Bid     post DDD pacemaker implant >> followed by DrKlein  . Peripheral neuropathy >> on Neurontin 300Qhs, Klonopin     Past Surgical History  Procedure Laterality Date  . Coronary artery bypass graft  10/04/1999    x4 -- LIMA graft to LAD, saphenous vein graft to the diagonal, saphenous vein graft to the first OM vessel, and saphenous vein graft  to the distal RCA  . Cardiac catheterization  10/03/1999    Est. EF of 65% -- Severe left main and two-vessel obstructive atherosclerotic coronary  -- Diffuse ectasia of the right coronary artery -- Normal left ventricular function  . Right knee surgery    . Ep implantable device N/A 10/08/2014    Procedure:  PPM Generator Changeout;  Surgeon: Deboraha Sprang, MD;  Location: [facility];  Service: Cardiovascular;  Laterality: N/A;    Outpatient Encounter Prescriptions as of 11/12/2014  Medication Sig  . acetaminophen (TYLENOL) 325 MG tablet Take 650 mg by mouth as needed for mild pain or moderate pain.  Marland Kitchen albuterol (PROAIR HFA) 108 (90 BASE) MCG/ACT inhaler Inhale 2 puffs into the lungs every 4 (four) hours as needed for wheezing or shortness of  breath.  Marland Kitchen atorvastatin (LIPITOR) 40 MG tablet TAKE 1 TABLET (40 MG TOTAL) BY MOUTH DAILY.  . budesonide-formoterol (SYMBICORT) 160-4.5 MCG/ACT inhaler Inhale 2 puffs into the lungs 2 (two) times daily.  . dabigatran (PRADAXA) 150 MG CAPS capsule TAKE 1 CAPSULE (150 MG TOTAL) BY MOUTH EVERY 12 (TWELVE) HOURS.  . ferrous sulfate 325 (65 FE) MG tablet Take 325 mg by mouth daily.    . furosemide (LASIX) 20 MG tablet TAKE 1 TABLET (20 MG TOTAL) BY MOUTH DAILY.  Marland Kitchen gabapentin (NEURONTIN) 300 MG capsule Take 1 capsule (300 mg total) by mouth at bedtime.  Marland Kitchen ipratropium (ATROVENT) 0.03 % nasal spray Place 1 spray into both nostrils at bedtime.  . metoprolol succinate (TOPROL-XL) 50 MG 24 hr tablet Take 25 mg by mouth daily.   . Multiple Vitamin (MULTI-VITAMIN PO) Take 1 tablet by mouth daily.   . ramelteon (ROZEREM) 8 MG tablet Take 1 tablet (8 mg total) by mouth at bedtime as needed for sleep.  Marland Kitchen tiotropium (SPIRIVA HANDIHALER) 18 MCG inhalation capsule Place 1 capsule (18 mcg total) into inhaler and inhale daily.    No Known Allergies   Immunization History  Administered Date(s) Administered  . Influenza Split 01/23/2013  . Influenza,inj,Quad  PF,36+ Mos 04/03/2014    Current Medications, Allergies, Past Medical History, Past Surgical History, Family History, and Social History were reviewed in [employer] record.   Review of Systems            All symptoms NEG except where BOLDED >>  Constitutional:  F/C/S, fatigue, anorexia, unexpected weight change. HEENT:  HA, visual changes, hearing loss, earache, nasal symptoms, sore throat, mouth sores, hoarseness. Resp:  cough, sputum, hemoptysis; SOB, tightness, wheezing. Cardio:  CP, palpit, DOE, orthopnea, edema. GI:  N/V/D/C, blood in stool; reflux, abd pain, distention, gas. GU:  dysuria, freq, urgency, hematuria, flank pain, voiding difficulty. MS:  joint pain, swelling, tenderness, decr ROM; neck pain, back pain, etc. Neuro:  HA, tremors, seizures, dizziness, syncope, weakness, numbness, gait abn. Skin:  suspicious lesions or skin rash. Heme:  adenopathy, bruising, bleeding. Psyche:  confusion, agitation, sleep disturbance, hallucinations, anxiety, depression suicidal.   Objective:   Physical Exam      Vital Signs:  Reviewed...  General:  WD, WN, 81 y/o WM in NAD; alert & oriented; pleasant & cooperative... HEENT:  [place]/AT; Conjunctiva- pink, Sclera- nonicteric, EOM-wnl, PERRLA, EACs-clear, TMs-wnl; NOSE-clear; THROAT-clear & wnl. Neck:  Supple w/ fair ROM; no JVD; normal carotid impulses w/o bruits; no thyromegaly or nodules palpated; no lymphadenopathy. Chest:  decr BS bilat; without wheezes, rales, or rhonchi heard. Heart:  Regular Rhythm; pacer, without murmurs, rubs, or gallops detected. Abdomen:  Soft & nontender- no guarding or rebound; normal bowel sounds; no organomegaly or masses palpated. Ext:  Normal ROM; without deformities or arthritic changes; no varicose veins, venous insuffic, or edema;  Pulses intact w/o bruits. Neuro:  No focal neuro deficits; gait normal & balance OK. Derm:  No lesions noted; no rash etc. Lymph:  No cervical,  supraclavicular, axillary, or inguinal adenopathy palpated.   Assessment:      IMP >> COPD/emphysema ==> continue inhalers, O2 at 2L/min, exercise... OSA on CPAP ==> continue CPAP & O2 at 2L/min 24/7 & check ONO... Right heart failure and pulmonary HTN ==> continue Lasix20, plan f/u BNP, 2DEcho later... CAD- s/p CABG => per DrJordan AFib, Hx tachy-brady w/ pacer => per DrKlein w/ recent generator change DM w/ neuropathy Hiatus hernia Renal cyst  PLAN >>  No labs done today, he has upcoming appt w/ DrJordan, Lasix20 refilled & reminded NO SALT, we will arrange for ONO on CPAP/ on O2 to confirm no hypoxemia at night, needs pulse dose set up & he would benefit from POC;  Plan ROV 61mo, sooner prn...     Plan:     Patient's Medications  New Prescriptions   No medications on file  Previous Medications   ACETAMINOPHEN (TYLENOL) 325 MG TABLET    Take 650 mg by mouth as needed for mild pain or moderate pain.   ALBUTEROL (PROAIR HFA) 108 (90 BASE) MCG/ACT INHALER    Inhale 2 puffs into the lungs every 4 (four) hours as needed for wheezing or shortness of breath.   ATORVASTATIN (LIPITOR) 40 MG TABLET    TAKE 1 TABLET (40 MG TOTAL) BY MOUTH DAILY.   BUDESONIDE-FORMOTEROL (SYMBICORT) 160-4.5 MCG/ACT INHALER    Inhale 2 puffs into the lungs 2 (two) times daily.   DABIGATRAN (PRADAXA) 150 MG CAPS CAPSULE    TAKE 1 CAPSULE (150 MG TOTAL) BY MOUTH EVERY 12 (TWELVE) HOURS.   FERROUS SULFATE 325 (65 FE) MG TABLET    Take 325 mg by mouth daily.     IPRATROPIUM (ATROVENT) 0.03 % NASAL SPRAY    Place 1 spray into both nostrils at bedtime.   METOPROLOL SUCCINATE (TOPROL-XL) 50 MG 24 HR TABLET    Take 25 mg by mouth daily.    MULTIPLE VITAMIN (MULTI-VITAMIN PO)    Take 1 tablet by mouth daily.    RAMELTEON (ROZEREM) 8 MG TABLET    Take 1 tablet (8 mg total) by mouth at bedtime as needed for sleep.   TIOTROPIUM (SPIRIVA HANDIHALER) 18 MCG INHALATION CAPSULE    Place 1 capsule (18 mcg total) into inhaler  and inhale daily.  Modified Medications   Modified Medication Previous Medication   FUROSEMIDE (LASIX) 20 MG TABLET furosemide (LASIX) 20 MG tablet      TAKE 1 TABLET (20 MG TOTAL) BY MOUTH DAILY.    TAKE 1 TABLET (20 MG TOTAL) BY MOUTH DAILY.   GABAPENTIN (NEURONTIN) 300 MG CAPSULE gabapentin (NEURONTIN) 300 MG capsule      Take 1 capsule (300 mg total) by mouth at bedtime.    Take 1 capsule (300 mg total) by mouth at bedtime.  Discontinued Medications   No medications on file

## 2014-11-14 ENCOUNTER — Telehealth: Payer: Self-pay | Admitting: Pulmonary Disease

## 2014-11-14 NOTE — Telephone Encounter (Signed)
error 

## 2014-11-17 ENCOUNTER — Telehealth: Payer: Self-pay | Admitting: Internal Medicine

## 2014-11-17 NOTE — Telephone Encounter (Signed)
Unclear why this encounter was created.

## 2014-11-25 ENCOUNTER — Other Ambulatory Visit: Payer: Self-pay | Admitting: Adult Health

## 2014-11-30 ENCOUNTER — Encounter: Payer: Self-pay | Admitting: Nurse Practitioner

## 2014-11-30 NOTE — Progress Notes (Signed)
Electrophysiology Office Note Date: 12/01/2014  ID:  Roy Banks, DOB Oct 04, 1930, MRN [id number]  PCP: Leonard Downing, MD Primary Cardiologist: Martinique Electrophysiologist: Caryl Comes  CC: Pacemaker follow-up  [name] is a 81 y.o. male seen today for Dr Caryl Comes.  He underwent PPM gen change earlier this year and presents today for follow up.  Since generator change, he reports doing reasonably well.  He has stable DOE but denies palpitations, PND, orthopnea, nausea, vomiting, dizziness, syncope, edema, weight gain, or early satiety.  He lives in [facility] but continues to request that his healthcare be provided in [name]. He was seen by Dr Martinique earlier today.   Device History: MDT dual chamber PPM implanted 2009 for tachy/brady syndrome; gen change 2016   Past Medical History  Diagnosis Date  . HTN (hypertension)   . Hypercholesterolemia   . Sleep apnea   . COPD (chronic obstructive pulmonary disease)   . Coronary artery disease     post CABG x4 in June, 2001  . Tachy-brady syndrome     a. s/p MDT dual chamber PPM  . Peripheral neuropathy   . Pulmonary HTN   . Right heart failure    Past Surgical History  Procedure Laterality Date  . Coronary artery bypass graft  10/04/1999    x4 -- LIMA graft to LAD, saphenous vein graft to the diagonal, saphenous vein graft to the first OM vessel, and saphenous vein graft to the distal RCA  . Cardiac catheterization  10/03/1999    Est. EF of 65% -- Severe left main and two-vessel obstructive atherosclerotic coronary  -- Diffuse ectasia of the right coronary artery -- Normal left ventricular function  . Right knee surgery    . Ep implantable device N/A 10/08/2014    MDT dual chamber PPM gen change by Dr Caryl Comes    Current Outpatient Prescriptions  Medication Sig Dispense Refill  . acetaminophen (TYLENOL) 325 MG tablet Take 650 mg by mouth as needed for mild pain or moderate pain.    Marland Kitchen atorvastatin (LIPITOR) 40 MG tablet TAKE 1  TABLET (40 MG TOTAL) BY MOUTH DAILY. 90 tablet 3  . budesonide-formoterol (SYMBICORT) 160-4.5 MCG/ACT inhaler Inhale 2 puffs into the lungs 2 (two) times daily.    . dabigatran (PRADAXA) 150 MG CAPS capsule TAKE 1 CAPSULE (150 MG TOTAL) BY MOUTH EVERY 12 (TWELVE) HOURS. 180 capsule 3  . ferrous sulfate 325 (65 FE) MG tablet Take 325 mg by mouth daily.      . furosemide (LASIX) 20 MG tablet TAKE 1 TABLET (20 MG TOTAL) BY MOUTH DAILY. 90 tablet 3  . gabapentin (NEURONTIN) 300 MG capsule Take 1 capsule (300 mg total) by mouth at bedtime. 30 capsule 3  . ipratropium (ATROVENT) 0.03 % nasal spray Place 1 spray into both nostrils at bedtime. 30 mL 2  . metoprolol succinate (TOPROL-XL) 50 MG 24 hr tablet Take 25 mg by mouth daily.     . Multiple Vitamin (MULTI-VITAMIN PO) Take 1 tablet by mouth daily.     . OXYGEN Inhale 2 L into the lungs continuous.    Marland Kitchen tiotropium (SPIRIVA HANDIHALER) 18 MCG inhalation capsule Place 1 capsule (18 mcg total) into inhaler and inhale daily. 30 capsule 6  . albuterol (PROAIR HFA) 108 (90 BASE) MCG/ACT inhaler Inhale 2 puffs into the lungs every 4 (four) hours as needed for wheezing or shortness of breath. (Patient not taking: Reported on 12/01/2014) 1 Inhaler 0  . ramelteon (ROZEREM) 8 MG tablet  Take 1 tablet (8 mg total) by mouth at bedtime as needed for sleep. (Patient not taking: Reported on 12/01/2014) 30 tablet 1   No current facility-administered medications for this visit.    Allergies:   Review of patient's allergies indicates no known allergies.   Social History: History   Social History  . Marital Status: Married    Spouse Name: N/A  . Number of Children: 2  . Years of Education: N/A   Occupational History  . small Arts development officer    Social History Main Topics  . Smoking status: Former Smoker -- 1.00 packs/day for 32 years    Types: Cigarettes    Quit date: 11/26/1982  . Smokeless tobacco: Former Systems developer    Types: [place] date: 11/26/2002      Comment: quit smoking 31 yrs ago  . Alcohol Use: No  . Drug Use: No  . Sexual Activity: Not on file   Other Topics Concern  . Not on file   Social History Narrative    Family History: Family History  Problem Relation Age of Onset  . Heart attack Father   . Heart attack Mother 90    and has had previous angioplasty     Review of Systems: All other systems reviewed and are otherwise negative except as noted above.   Physical Exam: VS:  BP 140/80 mmHg  Pulse 76  Ht 5\' 8"  (1.727 m)  Wt 169 lb (76.658 kg)  BMI 25.70 kg/m2  SpO2 96% , BMI Body mass index is 25.7 kg/(m^2).  GEN- The patient is chronically ill appearing, alert and oriented x 3 today.   HEENT: normocephalic, atraumatic; sclera clear, conjunctiva pink; hearing intact; oropharynx clear; neck supple  Lungs- Clear to ausculation bilaterally, normal work of breathing.  No wheezes, rales, rhonchi Heart- Regular rate and rhythm, 2/6 SEM GI- soft, non-tender, non-distended, bowel sounds present  Extremities- no clubbing, cyanosis, 1+ edema  MS- no significant deformity or atrophy Skin- warm and dry, no rash or lesion; PPM pocket well healed Psych- euthymic mood, full affect Neuro- strength and sensation are intact  PPM Interrogation- reviewed in detail today,  See PACEART report  EKG:  EKG is not ordered today.  Recent Labs: 09/25/2014: ALT 32; B Natriuretic Peptide 1155.1* 09/27/2014: Magnesium 2.0 10/01/2014: BUN 30*; Creatinine, Ser 1.00; Hemoglobin 16.1; Platelets 166.0; Potassium 4.1; Sodium 139   Wt Readings from Last 3 Encounters:  12/01/14 169 lb (76.658 kg)  12/01/14 168 lb 1 oz (76.233 kg)  11/12/14 165 lb 6.4 oz (75.025 kg)     Other studies Reviewed: Additional studies/ records that were reviewed today include: Dr Olin Pia office notes, recent op note  Assessment and Plan:  1.  Complete heart block Normal PPM function See Pace Art report No changes today  2.  Permanent atrial  fibrillation Continue pradaxa for CHADS2VASC of at least 4 (recent BMET, CBC stable)  3.  CAD No recent ischemic symptoms Seen today by Dr Martinique   Current medicines are reviewed at length with the patient today.   The patient does not have concerns regarding his medicines.  The following changes were made today:  none  Labs/ tests ordered today include: none   Disposition:   Follow up with [facility] transmissions every 3 months, follow up with Dr Caryl Comes 1 year   Signed, Chanetta Marshall, NP 12/01/2014 12:01 PM  [address] [place] [address] [phone] (office) [phone] (fax)

## 2014-12-01 ENCOUNTER — Ambulatory Visit (INDEPENDENT_AMBULATORY_CARE_PROVIDER_SITE_OTHER): Payer: Medicare Other | Admitting: Nurse Practitioner

## 2014-12-01 ENCOUNTER — Ambulatory Visit (INDEPENDENT_AMBULATORY_CARE_PROVIDER_SITE_OTHER): Payer: Medicare Other | Admitting: Cardiology

## 2014-12-01 ENCOUNTER — Encounter: Payer: Self-pay | Admitting: Cardiology

## 2014-12-01 ENCOUNTER — Encounter: Payer: Self-pay | Admitting: Nurse Practitioner

## 2014-12-01 ENCOUNTER — Encounter: Payer: Self-pay | Admitting: Internal Medicine

## 2014-12-01 VITALS — BP 140/80 | HR 76 | Ht 68.0 in | Wt 169.0 lb

## 2014-12-01 VITALS — BP 158/89 | HR 76 | Ht 68.0 in | Wt 168.1 lb

## 2014-12-01 DIAGNOSIS — E785 Hyperlipidemia, unspecified: Secondary | ICD-10-CM

## 2014-12-01 DIAGNOSIS — Z950 Presence of cardiac pacemaker: Secondary | ICD-10-CM

## 2014-12-01 DIAGNOSIS — R001 Bradycardia, unspecified: Secondary | ICD-10-CM

## 2014-12-01 DIAGNOSIS — J418 Mixed simple and mucopurulent chronic bronchitis: Secondary | ICD-10-CM

## 2014-12-01 DIAGNOSIS — I481 Persistent atrial fibrillation: Secondary | ICD-10-CM

## 2014-12-01 DIAGNOSIS — I25810 Atherosclerosis of coronary artery bypass graft(s) without angina pectoris: Secondary | ICD-10-CM | POA: Diagnosis not present

## 2014-12-01 DIAGNOSIS — I4819 Other persistent atrial fibrillation: Secondary | ICD-10-CM

## 2014-12-01 NOTE — Patient Instructions (Signed)
You need to continue to restrict your sodium intake.  Continue your current medication.  I will see you in 3 months

## 2014-12-01 NOTE — Patient Instructions (Signed)
Medication Instructions:   Your physician recommends that you continue on your current medications as directed. Please refer to the Current Medication list given to you today.    Labwork.  NONE ORDER TODAY  Testing/Procedures:  NONE ORDER TODAY   Follow-Up:  Remote monitoring is used to monitor your Pacemaker of ICD from home. This monitoring reduces the number of office visits required to check your device to one time per year. It allows [place] to keep an eye on the functioning of your device to ensure it is working properly. You are scheduled for a device check from home on .    /    /        You may send your transmission at any time that day. If you have a wireless device, the transmission will be sent automatically. After your physician reviews your transmission, you will receive a postcard with your next transmission date.  Your physician wants you to follow-up in: [place] will receive a reminder letter in the mail two months in advance. If you don't receive a letter, please call our office to schedule the follow-up appointment.     Any Other Special Instructions Will Be Listed Below (If Applicable).

## 2014-12-01 NOTE — Progress Notes (Signed)
[name] Date of Birth: 1930-08-24   History of Present Illness: [name] is seen for followup CAD and Afib. He has a history of CAD and is s/p CABG in 2001. He had a normal Myoview in June 2013.  He has a history of atrial fibrillation and tachy-brady syndrome. He is s/p pacemaker implant in 2009 and had generator revision in June 2016.  He is on Pradaxa. He was hospitalized in June with respiratory failure secondary to COPD exacerbation. He is now on home oxygen. Noted on Echo to have severe pulmonary HTN with right heart enlargement. Now notes minimal cough. Some wheezing. Swelling is better on current lasix dose. Still desats when off oxygen.   Current Outpatient Prescriptions on File Prior to Visit  Medication Sig Dispense Refill  . acetaminophen (TYLENOL) 325 MG tablet Take 650 mg by mouth as needed for mild pain or moderate pain.    Marland Kitchen albuterol (PROAIR HFA) 108 (90 BASE) MCG/ACT inhaler Inhale 2 puffs into the lungs every 4 (four) hours as needed for wheezing or shortness of breath. 1 Inhaler 0  . atorvastatin (LIPITOR) 40 MG tablet TAKE 1 TABLET (40 MG TOTAL) BY MOUTH DAILY. 90 tablet 3  . budesonide-formoterol (SYMBICORT) 160-4.5 MCG/ACT inhaler Inhale 2 puffs into the lungs 2 (two) times daily.    . dabigatran (PRADAXA) 150 MG CAPS capsule TAKE 1 CAPSULE (150 MG TOTAL) BY MOUTH EVERY 12 (TWELVE) HOURS. 180 capsule 3  . ferrous sulfate 325 (65 FE) MG tablet Take 325 mg by mouth daily.      . furosemide (LASIX) 20 MG tablet TAKE 1 TABLET (20 MG TOTAL) BY MOUTH DAILY. 90 tablet 3  . gabapentin (NEURONTIN) 300 MG capsule Take 1 capsule (300 mg total) by mouth at bedtime. 30 capsule 3  . ipratropium (ATROVENT) 0.03 % nasal spray Place 1 spray into both nostrils at bedtime. 30 mL 2  . metoprolol succinate (TOPROL-XL) 50 MG 24 hr tablet Take 25 mg by mouth daily.     . Multiple Vitamin (MULTI-VITAMIN PO) Take 1 tablet by mouth daily.     . ramelteon (ROZEREM) 8 MG tablet Take 1 tablet  (8 mg total) by mouth at bedtime as needed for sleep. 30 tablet 1  . tiotropium (SPIRIVA HANDIHALER) 18 MCG inhalation capsule Place 1 capsule (18 mcg total) into inhaler and inhale daily. 30 capsule 6   No current facility-administered medications on file prior to visit.    No Known Allergies  Past Medical History  Diagnosis Date  . HTN (hypertension)   . Hypercholesterolemia   . Sleep apnea   . COPD (chronic obstructive pulmonary disease)   . Coronary artery disease     post CABG x4 in June, 2001  . Tachy-brady syndrome     a. s/p MDT dual chamber PPM  . Peripheral neuropathy   . Pulmonary HTN   . Right heart failure     Past Surgical History  Procedure Laterality Date  . Coronary artery bypass graft  10/04/1999    x4 -- LIMA graft to LAD, saphenous vein graft to the diagonal, saphenous vein graft to the first OM vessel, and saphenous vein graft to the distal RCA  . Cardiac catheterization  10/03/1999    Est. EF of 65% -- Severe left main and two-vessel obstructive atherosclerotic coronary  -- Diffuse ectasia of the right coronary artery -- Normal left ventricular function  . Right knee surgery    . Ep implantable device N/A 10/08/2014  MDT dual chamber PPM gen change by Dr Caryl Comes    History  Smoking status  . Former Smoker -- 1.00 packs/day for 32 years  . Types: Cigarettes  . Quit date: 11/26/1982  Smokeless tobacco  . Former Systems developer  . Types: Chew  . Quit date: 11/26/2002    Comment: quit smoking 31 yrs ago    History  Alcohol Use No    Family History  Problem Relation Age of Onset  . Heart attack Father   . Heart attack Mother 28    and has had previous angioplasty    Review of Systems: As noted in history of present illness.   All other systems were reviewed and are negative.  Physical Exam: BP 158/89 mmHg  Pulse 76  Ht 5\' 8"  (1.727 m)  Wt 76.233 kg (168 lb 1 oz)  BMI 25.56 kg/m2 He is a pleasant white male in no acute distress. His HEENT exam is  unremarkable. He has prominent  JV pulsations without bruits. Lungs are clear. Cardiac exam reveals a regular rate and rhythm with a grade 1-1/9 systolic murmur in the apex. Abdomen is soft and nontender without masses or bruits. Extremities are with trace edema on the right and 1+ on the left. . Pedal pulses are good. His neurologic exam is nonfocal.  LABORATORY DATA: Lab Results  Component Value Date   WBC 7.6 10/01/2014   HGB 16.1 10/01/2014   HCT 48.4 10/01/2014   PLT 166.0 10/01/2014   GLUCOSE 120* 10/01/2014   CHOL 127 04/23/2012   TRIG 98.0 04/23/2012   HDL 43.70 04/23/2012   LDLDIRECT 200.1 01/13/2012   LDLCALC 64 04/23/2012   ALT 32 09/25/2014   AST 43* 09/25/2014   NA 139 10/01/2014   K 4.1 10/01/2014   CL 99 10/01/2014   CREATININE 1.00 10/01/2014   BUN 30* 10/01/2014   CO2 34* 10/01/2014   TSH 4.67 04/15/2013   Ecg today shows ventricular paced rhythm at rate 76. I have personally reviewed and interpreted this study.  Echo: 09/26/14:Study Conclusions  - Left ventricle: The cavity size was normal. Wall thickness was increased in a pattern of mild LVH. The estimated ejection fraction was 60%. - Aortic valve: Sclerosis without stenosis. There was no significant regurgitation. - Left atrium: The atrium was moderately to severely dilated. - Right ventricle: There is dilitation of the RV. Pacer is in place. Systolic function was moderately reduced. - Pulmonary arteries: PA peak pressure: 75 mm Hg (S).  Assessment / Plan: 1. Coronary disease status post CABG in 2001. Patient had a  Myoview study in June 2013 which showed no ischemia and normal ejection fraction. He has no symptoms of chest pain. We will continue with his current medical therapy.  2. Atrial fibrillation. He is on chronic anticoagulation. His rate control appears acceptable. He is status post pacemaker implant for bradycardia with recent revision.  3. Hyperlipidemia. On statin.  4. COPD-  progressive now on home oxygen.  5. Hypertension, controlled.  6. Right heart failure with cor pulmonale and severe pulmonary HTN. Continue current lasix dose. Stressed importance of sodium restriction.  I will follow up in 3 months.

## 2014-12-02 ENCOUNTER — Encounter: Payer: Medicare HMO | Admitting: Internal Medicine

## 2014-12-04 LAB — CUP PACEART INCLINIC DEVICE CHECK
Date Time Interrogation Session: 20160811143212
Lead Channel Setting Pacing Amplitude: 2.5 V
Lead Channel Setting Pacing Pulse Width: 0.4 ms
MDC IDC SET LEADCHNL RV SENSING SENSITIVITY: 2.8 mV

## 2014-12-12 ENCOUNTER — Other Ambulatory Visit: Payer: Self-pay | Admitting: Adult Health

## 2014-12-12 ENCOUNTER — Telehealth: Payer: Self-pay | Admitting: Pulmonary Disease

## 2014-12-12 NOTE — Telephone Encounter (Signed)
Pt states that nothing further needed. States that his Rx has been straightened out. Does not need refill of Spiriva until 12/16/14 and the current Rx they have on file has 6 refills. Will close message.

## 2014-12-17 ENCOUNTER — Telehealth: Payer: Self-pay | Admitting: Pulmonary Disease

## 2014-12-17 NOTE — Telephone Encounter (Signed)
Called spoke with pt. He reports he has not heard from [facility] about having his ONO done. I called [facility] and reports they have not received anything. They would also need pt last OV note. According to our records the information was sent to [facility] but according to pt he is with [facility]. Please advise thanks

## 2014-12-18 NOTE — Telephone Encounter (Signed)
Called and spoke with patient and he is with [facility] for his o2 and [facility] with his CPAP. His local [place] phone number is [phone]. I advised patient that I will re-fax order to [place] and will call them as well. Called and spoke with Hassan Rowan at the [place] office and she stated that they just got the order yesterday (local [place] office sent order to them on 11/12/14) and the order and ov notes have to be within 30 days. The order was not to re-qualify him for o2 but for ONO on 2 lpm of 02 and CPAP to confirm no hypoxemia at night. Called and spoke with local [facility], and spoke with Alvis Lemmings who has been in contact with Estill Bamberg at the [facility]. Rodena Piety will contact Estill Bamberg and clarify this order with her and let her know that this is totally different than re-qualifying a patient for o2. That this order is separate and apart. Alvis Lemmings with local [facility] called Hassan Rowan and Estill Bamberg at the [place] office and explained what we have requested and made them aware that this order needs to be handled today. [facility] in [place] will drop off equipment today for patient to do this. Called and spoke with patient and he is aware. Nothing else needed at this time. Pt advised that if he didn't get this equipment today or Friday to call us back on Monday. Rhonda J Cobb

## 2014-12-18 NOTE — Telephone Encounter (Signed)
pcc's please advise.  Thanks!! 

## 2014-12-18 NOTE — Telephone Encounter (Signed)
Order was sent to [place]. Called [facility] and spoke with [facility] and she stated that Rodena Piety has sent a message to the [facility] in [place], [place] who is suppose to contact patient today to arrange. Rhonda J Cobb

## 2015-01-13 ENCOUNTER — Encounter: Payer: Self-pay | Admitting: Internal Medicine

## 2015-01-13 ENCOUNTER — Ambulatory Visit (INDEPENDENT_AMBULATORY_CARE_PROVIDER_SITE_OTHER): Payer: Medicare Other | Admitting: Pulmonary Disease

## 2015-01-13 ENCOUNTER — Encounter: Payer: Self-pay | Admitting: Pulmonary Disease

## 2015-01-13 VITALS — BP 120/70 | HR 81 | Temp 97.4°F | Wt 168.4 lb

## 2015-01-13 DIAGNOSIS — J418 Mixed simple and mucopurulent chronic bronchitis: Secondary | ICD-10-CM | POA: Diagnosis not present

## 2015-01-13 DIAGNOSIS — I4819 Other persistent atrial fibrillation: Secondary | ICD-10-CM

## 2015-01-13 DIAGNOSIS — I2781 Cor pulmonale (chronic): Secondary | ICD-10-CM | POA: Diagnosis not present

## 2015-01-13 DIAGNOSIS — Z950 Presence of cardiac pacemaker: Secondary | ICD-10-CM

## 2015-01-13 DIAGNOSIS — I25810 Atherosclerosis of coronary artery bypass graft(s) without angina pectoris: Secondary | ICD-10-CM

## 2015-01-13 DIAGNOSIS — I481 Persistent atrial fibrillation: Secondary | ICD-10-CM

## 2015-01-13 DIAGNOSIS — I2510 Atherosclerotic heart disease of native coronary artery without angina pectoris: Secondary | ICD-10-CM

## 2015-01-13 DIAGNOSIS — G4733 Obstructive sleep apnea (adult) (pediatric): Secondary | ICD-10-CM | POA: Diagnosis not present

## 2015-01-13 MED ORDER — GABAPENTIN 300 MG PO CAPS: 300.0000 mg | ORAL_CAPSULE | Freq: Two times a day (BID) | ORAL | Status: DC

## 2015-01-13 MED ORDER — GABAPENTIN 100 MG PO CAPS: 300.0000 mg | ORAL_CAPSULE | Freq: Every day | ORAL | Status: DC

## 2015-01-13 NOTE — Addendum Note (Signed)
Addended by: Levander Campion on: 01/13/2015 05:01 PM   Modules accepted: Orders, Medications

## 2015-01-13 NOTE — Progress Notes (Signed)
Subjective:     Patient ID: Roy Banks, male   DOB: 03/22/1931, 81 y.o.   MRN: [id number]  HPI ~  October 10, 2014:  Initial pulmonary consult by SN>  51 y/o WM, referred by [facility] for on-going pulmonary evaluation & treatment> his PCP is Dr. Claris Gower; medical hx includes DM w/ neuropathy...      He was prev seen by DrWert, last 12/14> former smoker, quit 1984, followed for COPD w/ CTChest 2014 showing centrilobular emphysema & prev PFTs w/ mod airflow obstruction;  He's had cough, beige sput, wheezing, DOE and treated w/ Symbicort160-2spBid, Spiriva daily, ProventilHFA as needed... He was referred to pulmonary rehab but he never participated... He receives occas Pred tapers during acute exac;        Followed by Kallie Edward for OSA, last 6/15> on CPAP x8yrs, good compliance, uses Klonopin for RLS;        ADM by [facility] 6[facility]2 - 09/27/14 w/ hypoxemia & COPD exac, incr SOB but denied cough/ sput/ hemoptysis; found to have right heart failure/ PulmHTN;  Treated w/ Pred, Doxy, Oxygen started this adm, & continuation of Symbicort160, Spiriva, Proair;  He was diuresed w/ Lasix & asked to f/u w/ Cards to consider right heart cath...       Cardiology eval by DrKlein, seen 10/07/14>  Pacer implanted 2009 for tachybrady syndrome, persistent AFib on Pradaxa, hx CAD- s/pCABG, CHF w/ BNP=1150 & pedal edema; Myoview 2013 was neg for ischemia w/ EF=65%;  2DEcho 6/16 showed mildLVH, norm LVF w/ EF=60%, AVsclerosis w/o stenosis, mod to severe LAdil, RVdil w/ mod reduction in RV function & est PAsys~50mmHg... Pacer generator changed 10/08/14 by DrKlein...   EXAM showed Afeb, VSS, O2sat=91% on O2 at 2L/min;  HEENT- neg, mallampati2;  Chest- decr BS but w/o w/r/r;  Heart- pacer, RR, gr1/6SEM w/o r/g;  Abd- soft, neg;  Ext- trace edema w/o c/c...  Sleep study 12/1998 at [facility] reported mild OSA w/ RDI=9/hr, loud snoring, PLMS=5.5/hr, & lowest O2sat=91% on RA... Started on CPAP- details unknown, & Klonopin for  RLS...  CTChest 08/28/12 showed borderline heart size, no signif adenopathy, modHH, atherosclerosis in Ao, coronaries, & s/pCABG; mod to severe centrilobular emphysema, mild biapical pleuroparenchymal thickening, no other lung lesions; large right renal cyst...   CXR 09/25/14 showed s/pCABG w/ pacer in place, trace bilat effus, hyperinflated w/ incr markings, NAD...   2DEcho 6/16 showed mildLVH, norm LVF w/ EF=60%, AVsclerosis w/o stenosis, mod to severe LAdil, RVdil w/ mod reduction in RV function & est PAsys~31mmHg  LABS 6/16 showed elevBS, BNP=1150  Ambulatory oxygen saturation test> On 2L/min his O2sat=98% w/ pulse=61/min;  Walked 1 lap w/ O2sat=91% w/ pulse=76/min... IMP/PLAN>>   Roy Banks has COPD/emphysema on Symbicort, Spiriva, ProairHFA (he finished prev Doxy, Pred);  OSA on CPAP & we will contact [facility] for machine download data;  CorPulmonale w/ right heart failure and pulm art HTN=> on O2 at 2L/min 24/7 now & continue Lasix20...  ADDENDUM>> CPAP download date from [facility]> 5/6 - 09/27/14=> CPAP=7, 28/30 days, 7-8h per night, AHI=2.0; good compliance & effectiveness confirmed...    ~  November 12, 2014:  68mo OV w/ SN>   Roy Banks has moved to [place] he tells me that he still prefers to get his medical care here in [place];  His breathing is OK on his O2 at 2L/min 24/7 via Hue[facility] Romans- it is bled into his CPAP at night & using bottles during the day- he needs pulse dose set up & may be  better served w/ POC so we will inquire on his behalf...     COPD/emphysema> on Symbicort160-2spBid, Spiriva daily, ProairHFA prn (off prev Doxy, Pred); CT 2014 w/ mod to severe centrilob emphysema    OSA on CPAP> download from [place] 5/16 showed CPAP=7 (he says 5), good compliance & effectiveness confirmed w/ AHI=2...    Right heart failure and pulmonary HTN> 2DEcho 6/16 w/ RVdil & mod reduced RV function & PAsys est~38mmHg; started on O2 and diuresed w/ Lasix20; DrKlein incr to Lasix20Bid transiently...    CAD- s/p CABG>  followed by DrJordan for Cards on MetopER50-1/2, Lasix20/d; last OV 10/2013 & due for yearly check up soon...    AFib,Hx tachy-brady w/ pacer> on Pradaxa & followed by DrKlein; generator changed 10/08/14 & doing satis, he does tele-pacer checks monthly...    DM w/ neuropathy> on diet alone for his DM (BS in hosp 120-160, no A1c in Epic); on Neurontin300Qhs...     Hiatus hernia> aware, not currently on meds, denies reflux, heartburn, dysphagia, n/v, d/c, blood seen...       Renal cyst EXAM shows Afeb, VSS, O2sat=92% on 2l/min at rest;  HEENT- neg, mallampati2;  Chest- decr BS but w/o w/r/r;  Heart- pacer, RR, gr1/6SEM w/o r/g;  Abd- soft, neg;  Ext- trace edema w/o c/c... IMP/PLAN>>  No labs done today, he has upcoming appt w/ DrJordan, Lasix20 refilled & reminded NO SALT, we will arrange for ONO on CPAP/ on O2 to confirm no hypoxemia at night, needs pulse dose set up & he would benefit from POC;  Plan ROV 64mo, sooner prn... ADDENDUM>>  ONO done 12/20/14 on CPAP & on O2 at 2L/min showed resolution of the nocturnal hypoxemia- mean O2sat=94.7%, time <88% was 37min36sec (0.4% of the recording) and he was >90% for 99.5%; continue same.   ~  January 13, 2015:  39mo ROV w/ SN>   Roy Banks returns and reports a stable 79mo interval- he remains on O2at 2L/min, Symbicort160-2spBid, Spriva daily, AlbutHFA as needed; he notes that he is not resting as well recently & blames a noise his CPAP machine makes now that the oxygen is bled-in; he also has c/o mask fit => we will ask [facility] to check his machine & mask...  Roy Banks also notes right leg pain (esp at night) & he was taking Gabapentin 300mg Qhs but wondered if this was the cause so he stopped it; we discussed this & noted how this is a low dose for neuropathy problems asking him to try increasing the medication to 2 tabsQhs; wife notes more of a prob from RLS by her description...     He remains on ToprolXL25, Lasix20, Pradaxa150Bid; CAD, s/p CABG, AFib, hx tachy-brady w/  pacer, right heart failure & PulmHTN...    He had f/u DrJordan for Cards 8/16> note reviewed, they stressed sodium restriction and continued the same meds...    He also had a f/u appt w/ DrKlein 8/16> note reviewed, normal pacer function, rec to continue same meds & teletrace...  EXAM shows Afeb, VSS, O2sat=95% on 2l/min at rest;  HEENT- neg, mallampati1;  Chest- decr BS but w/o w/r/r;  Heart- pacer, irreg, gr1/6SEM w/o r/g;  Abd- soft, neg;  Ext- trace edema w/o c/c... IMP/PLAN>>  We discussed his medications and the only rec change today is to try incr the Gabapentin from 300mg Qhs to 600gmQhs & see if we can diminish his nocturnal leg discomfort; he will also restart an exercise program rec by a former chiro to help his  back & legs...    Past Medical History  Diagnosis Date  . HTN (hypertension) >> on MetoprololER50-1/2Qd, Lasix20   . Hypercholesterolemia >> on Lipitor40   . Sleep apnea >> on CPAP followed by DrAlva   . COPD (chronic obstructive pulmonary disease) w/ CorPulmonale- O2 started 09/2014 on 2L/min    . Coronary artery disease >> followed by DrJordan     post CABG x4 in June, 2001  . Tachy-brady syndrome   . Atrial fibrillation >> on Pradaxa 150Bid     post DDD pacemaker implant >> followed by DrKlein  . Peripheral neuropathy >> on Neurontin 300Qhs, Klonopin (off now)     Past Surgical History  Procedure Laterality Date  . Coronary artery bypass graft  10/04/1999    x4 -- LIMA graft to LAD, saphenous vein graft to the diagonal, saphenous vein graft to the first OM vessel, and saphenous vein graft to the distal RCA  . Cardiac catheterization  10/03/1999    Est. EF of 65% -- Severe left main and two-vessel obstructive atherosclerotic coronary  -- Diffuse ectasia of the right coronary artery -- Normal left ventricular function  . Right knee surgery    . Ep implantable device N/A 10/08/2014    MDT dual chamber PPM gen change by Dr Caryl Comes    Outpatient Encounter Prescriptions as  of 01/13/2015  Medication Sig  . acetaminophen (TYLENOL) 325 MG tablet Take 650 mg by mouth as needed for mild pain or moderate pain.  Marland Kitchen albuterol (PROAIR HFA) 108 (90 BASE) MCG/ACT inhaler Inhale 2 puffs into the lungs every 4 (four) hours as needed for wheezing or shortness of breath.  Marland Kitchen atorvastatin (LIPITOR) 40 MG tablet TAKE 1 TABLET (40 MG TOTAL) BY MOUTH DAILY.  . budesonide-formoterol (SYMBICORT) 160-4.5 MCG/ACT inhaler Inhale 2 puffs into the lungs 2 (two) times daily.  . dabigatran (PRADAXA) 150 MG CAPS capsule TAKE 1 CAPSULE (150 MG TOTAL) BY MOUTH EVERY 12 (TWELVE) HOURS.  . ferrous sulfate 325 (65 FE) MG tablet Take 325 mg by mouth daily.    . furosemide (LASIX) 20 MG tablet TAKE 1 TABLET (20 MG TOTAL) BY MOUTH DAILY.  Marland Kitchen gabapentin (NEURONTIN) 300 MG capsule Take 1 capsule (300 mg total) by mouth at bedtime.  Marland Kitchen ipratropium (ATROVENT) 0.03 % nasal spray Place 1 spray into both nostrils at bedtime.  . metoprolol succinate (TOPROL-XL) 50 MG 24 hr tablet Take 25 mg by mouth daily.   . Multiple Vitamin (MULTI-VITAMIN PO) Take 1 tablet by mouth daily.   . OXYGEN Inhale 2 L into the lungs continuous.  . ramelteon (ROZEREM) 8 MG tablet Take 1 tablet (8 mg total) by mouth at bedtime as needed for sleep.  Marland Kitchen tiotropium (SPIRIVA HANDIHALER) 18 MCG inhalation capsule Place 1 capsule (18 mcg total) into inhaler and inhale daily.    No Known Allergies   Immunization History  Administered Date(s) Administered  . Influenza Split 01/23/2013  . Influenza,inj,Quad PF,36+ Mos 04/03/2014    Current Medications, Allergies, Past Medical History, Past Surgical History, Family History, and Social History were reviewed in [employer] record.   Review of Systems            All symptoms NEG except where BOLDED >>  Constitutional:  F/C/S, fatigue, anorexia, unexpected weight change. HEENT:  HA, visual changes, hearing loss, earache, nasal symptoms, sore throat, mouth sores,  hoarseness. Resp:  cough, sputum, hemoptysis; SOB, tightness, wheezing. Cardio:  CP, palpit, DOE, orthopnea, edema. GI:  N/V/D/C, blood  in stool; reflux, abd pain, distention, gas. GU:  dysuria, freq, urgency, hematuria, flank pain, voiding difficulty. MS:  joint pain, swelling, tenderness, decr ROM; neck pain, back pain, etc. Neuro:  HA, tremors, seizures, dizziness, syncope, weakness, numbness, gait abn. Skin:  suspicious lesions or skin rash. Heme:  adenopathy, bruising, bleeding. Psyche:  confusion, agitation, sleep disturbance, hallucinations, anxiety, depression suicidal.   Objective:   Physical Exam      Vital Signs:  Reviewed...  General:  WD, WN, 81 y/o WM in NAD; alert & oriented; pleasant & cooperative... HEENT:  [place]/AT; Conjunctiva- pink, Sclera- nonicteric, EOM-wnl, PERRLA, EACs-clear, TMs-wnl; NOSE-clear; THROAT-clear & wnl. Neck:  Supple w/ fair ROM; no JVD; normal carotid impulses w/o bruits; no thyromegaly or nodules palpated; no lymphadenopathy. Chest:  decr BS bilat; without wheezes, rales, or rhonchi heard. Heart:  irreg; pacer, without murmurs, rubs, or gallops detected. Abdomen:  Soft & nontender- no guarding or rebound; normal bowel sounds; no organomegaly or masses palpated. Ext:  Normal ROM; without deformities or arthritic changes; no varicose veins, venous insuffic, or edema;  Pulses intact w/o bruits. Neuro:  No focal neuro deficits; gait normal & balance OK. Derm:  No lesions noted; no rash etc. Lymph:  No cervical, supraclavicular, axillary, or inguinal adenopathy palpated.   Assessment:      IMP >>    COPD/emphysema ==> continue inhalers (Symbicort160, Spiriva), O2 at 2L/min, exercise...    OSA on CPAP ==> continue CPAP & O2 at 2L/min 24/7 & ONO check was ok...    Right heart failure and pulmonary HTN ==> continue Lasix20 & O2; plan f/u BNP, 2DEcho later...    CAD- s/p CABG => per DrJordan    AFib, Hx tachy-brady w/ pacer => per DrKlein w/ recent  generator change    DM w/ neuropathy => on Gabapentin300Qhs    Hiatus hernia    Renal cyst  PLAN >>  We discussed his medications and the only rec change today is to try incr the Gabapentin from 300mg Qhs to 600gmQhs & see if we can diminish his nocturnal leg discomfort; he will also restart an exercise program rec by a former chiro to help his back & legs...     Plan:     Patient's Medications  New Prescriptions  Previous Medications   ACETAMINOPHEN (TYLENOL) 325 MG TABLET    Take 650 mg by mouth as needed for mild pain or moderate pain.   ALBUTEROL (PROAIR HFA) 108 (90 BASE) MCG/ACT INHALER    Inhale 2 puffs into the lungs every 4 (four) hours as needed for wheezing or shortness of breath.   ATORVASTATIN (LIPITOR) 40 MG TABLET    TAKE 1 TABLET (40 MG TOTAL) BY MOUTH DAILY.   BUDESONIDE-FORMOTEROL (SYMBICORT) 160-4.5 MCG/ACT INHALER    Inhale 2 puffs into the lungs 2 (two) times daily.   DABIGATRAN (PRADAXA) 150 MG CAPS CAPSULE    TAKE 1 CAPSULE (150 MG TOTAL) BY MOUTH EVERY 12 (TWELVE) HOURS.   FERROUS SULFATE 325 (65 FE) MG TABLET    Take 325 mg by mouth daily.     FUROSEMIDE (LASIX) 20 MG TABLET    TAKE 1 TABLET (20 MG TOTAL) BY MOUTH DAILY.   GABAPENTIN (NEURONTIN) 300 MG CAPSULE    Take 2 capsules (600 mg total) by mouth at bedtime.   IPRATROPIUM (ATROVENT) 0.03 % NASAL SPRAY    Place 1 spray into both nostrils at bedtime.   METOPROLOL SUCCINATE (TOPROL-XL) 50 MG 24 HR TABLET    Take  25 mg by mouth daily.    MULTIPLE VITAMIN (MULTI-VITAMIN PO)    Take 1 tablet by mouth daily.    OXYGEN    Inhale 2 L into the lungs continuous.   RAMELTEON (ROZEREM) 8 MG TABLET    Take 1 tablet (8 mg total) by mouth at bedtime as needed for sleep.   TIOTROPIUM (SPIRIVA HANDIHALER) 18 MCG INHALATION CAPSULE    Place 1 capsule (18 mcg total) into inhaler and inhale daily.  Modified Medications   No medications on file  Discontinued Medications   No medications on file

## 2015-01-13 NOTE — Patient Instructions (Signed)
Today we updated your med list in our EPIC system...    Continue your current medications the same...  We discussed using OTC MUCINEX 600mg  tabs- 1-2 tabs twice daily as needed for the congestion...  We also discussed increasing your GABAPENTIN (Neurontin) 100mg  tabs from one per night, to 2 tabs, & then up to 3 tabs taken about 1H prior to bedtime...  Remember to re-start the chiropractic exercises...   We will ask [facility] about the CPAP machine noise and about your mask fit...  Call for any questions...  Let's plan a follow up visit in 3-2mo, sooner if needed for problems.Marland KitchenMarland Kitchen

## 2015-01-15 ENCOUNTER — Telehealth: Payer: Self-pay

## 2015-01-15 IMAGING — CR DG CHEST 2V
2 series · 2 of 2 positions shown · non-contrast
Comparison: 04/15/2013 radiographs.  CT 08/28/2012.

CLINICAL DATA: Follow up COPD. History of hypertension, atrial
fibrillation and smoking. Initial encounter.

EXAM:
CHEST  2 VIEW

[view not recorded (1 of 2)]
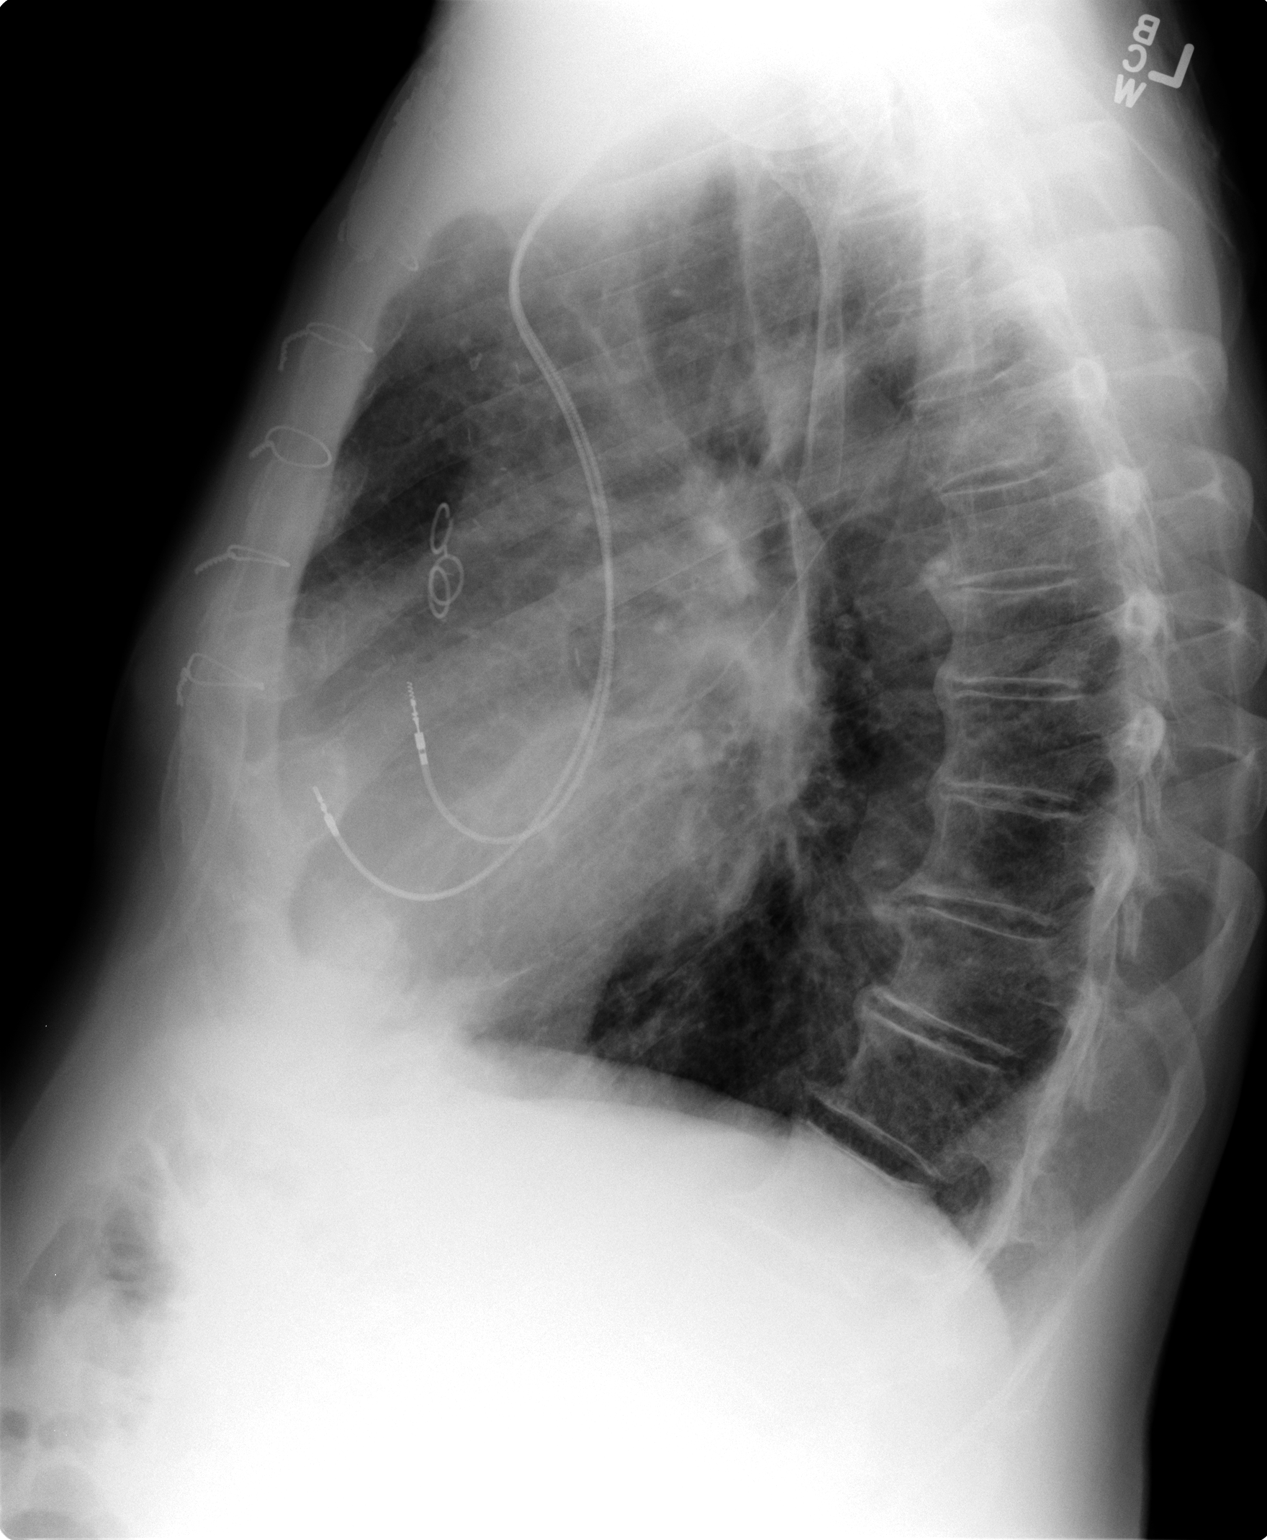

[view not recorded (2 of 2)]
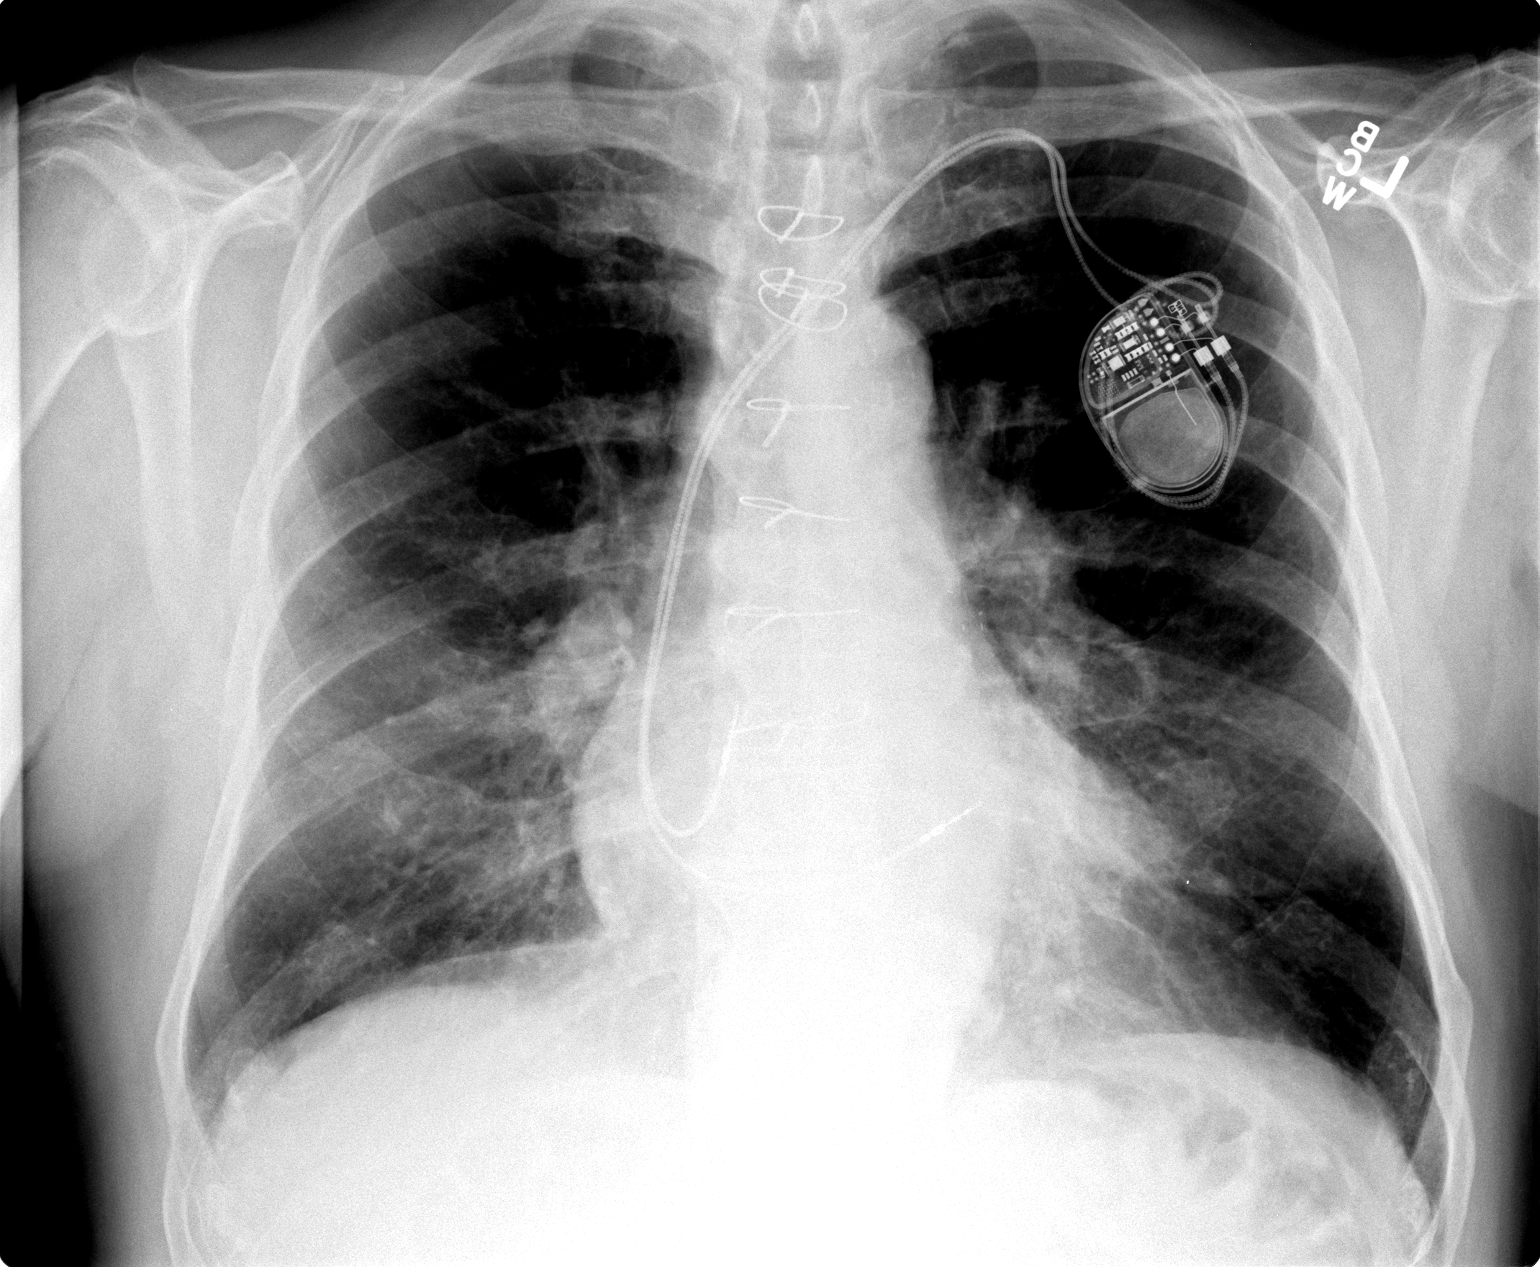

[2 of 2 positions shown; findings below may reference images not displayed]

FINDINGS: Left subclavian pacemaker leads are unchanged within the right
atrium and right ventricle. The heart size and mediastinal contours
are stable status post median sternotomy and CABG. Bilateral hilar
enlargement corresponds with central enlargement of the pulmonary
arteries on CT. The lungs are mildly hyperinflated with diffuse
emphysematous changes. No superimposed airspace disease, edema or
pleural effusion is evident. The osseous structures appear
unchanged.
IMPRESSION: Stable postoperative chest with diffuse changes of emphysema and
pulmonary arterial hypertension. No acute findings demonstrated.

## 2015-01-15 NOTE — Telephone Encounter (Signed)
Pradaxa approved for 1 year. [place] [id number]. Pharmacy notified.

## 2015-01-15 NOTE — Telephone Encounter (Signed)
PRIOR auth for Pradaxa 150mg  sent to [facility]

## 2015-01-21 ENCOUNTER — Other Ambulatory Visit: Payer: Self-pay | Admitting: Pulmonary Disease

## 2015-02-12 ENCOUNTER — Encounter: Payer: Self-pay | Admitting: Pulmonary Disease

## 2015-02-18 DIAGNOSIS — Z7189 Other specified counseling: Secondary | ICD-10-CM | POA: Diagnosis not present

## 2015-02-18 DIAGNOSIS — Z87891 Personal history of nicotine dependence: Secondary | ICD-10-CM | POA: Diagnosis not present

## 2015-02-18 DIAGNOSIS — E663 Overweight: Secondary | ICD-10-CM | POA: Diagnosis not present

## 2015-02-18 DIAGNOSIS — I10 Essential (primary) hypertension: Secondary | ICD-10-CM | POA: Diagnosis not present

## 2015-02-18 DIAGNOSIS — Z23 Encounter for immunization: Secondary | ICD-10-CM | POA: Diagnosis not present

## 2015-03-05 ENCOUNTER — Ambulatory Visit (INDEPENDENT_AMBULATORY_CARE_PROVIDER_SITE_OTHER): Payer: Medicare Other | Admitting: *Deleted

## 2015-03-05 DIAGNOSIS — I495 Sick sinus syndrome: Secondary | ICD-10-CM

## 2015-03-05 NOTE — Progress Notes (Signed)
Remote pacemaker transmission.   

## 2015-03-06 ENCOUNTER — Encounter: Payer: Self-pay | Admitting: Cardiology

## 2015-03-06 LAB — CUP PACEART REMOTE DEVICE CHECK
Battery Impedance: 100 Ohm
Battery Remaining Longevity: 150 mo
Brady Statistic RV Percent Paced: 96 %
Implantable Lead Implant Date: 20091218
Implantable Lead Implant Date: 20091218
Implantable Lead Location: 753860
Lead Channel Impedance Value: 603 Ohm
Lead Channel Pacing Threshold Amplitude: 0.5 V
Lead Channel Setting Pacing Amplitude: 2.5 V
Lead Channel Setting Pacing Pulse Width: 0.4 ms
MDC IDC LEAD LOCATION: 753859
MDC IDC MSMT BATTERY VOLTAGE: 2.79 V
MDC IDC MSMT LEADCHNL RA IMPEDANCE VALUE: 67 Ohm
MDC IDC MSMT LEADCHNL RV PACING THRESHOLD PULSEWIDTH: 0.4 ms
MDC IDC SESS DTM: 20161110145743
MDC IDC SET LEADCHNL RV SENSING SENSITIVITY: 5.6 mV

## 2015-03-12 ENCOUNTER — Ambulatory Visit (INDEPENDENT_AMBULATORY_CARE_PROVIDER_SITE_OTHER): Payer: Medicare Other | Admitting: Cardiology

## 2015-03-12 ENCOUNTER — Encounter: Payer: Self-pay | Admitting: Cardiology

## 2015-03-12 VITALS — BP 130/74 | HR 88 | Ht 68.0 in | Wt 171.2 lb

## 2015-03-12 DIAGNOSIS — I2510 Atherosclerotic heart disease of native coronary artery without angina pectoris: Secondary | ICD-10-CM | POA: Diagnosis not present

## 2015-03-12 DIAGNOSIS — I482 Chronic atrial fibrillation, unspecified: Secondary | ICD-10-CM

## 2015-03-12 DIAGNOSIS — I25810 Atherosclerosis of coronary artery bypass graft(s) without angina pectoris: Secondary | ICD-10-CM | POA: Diagnosis not present

## 2015-03-12 DIAGNOSIS — J418 Mixed simple and mucopurulent chronic bronchitis: Secondary | ICD-10-CM | POA: Diagnosis not present

## 2015-03-12 NOTE — Progress Notes (Signed)
[name] Date of Birth: October 02, 1930   History of Present Illness: Mr. Claydon is seen for followup CAD and Afib. He has a history of CAD and is s/p CABG in 2001. He had a normal Myoview in June 2013.  He has a history of atrial fibrillation and tachy-brady syndrome. He is s/p pacemaker implant in 2009 and had generator revision in June 2016.  He is on Pradaxa. He was hospitalized in June 2016 with respiratory failure secondary to COPD exacerbation. He is now on home oxygen. Noted on Echo to have severe pulmonary HTN with right heart enlargement.  On follow up today he is doing very well. No cough. No swelling. No chest pain or increased dyspnea.   Current Outpatient Prescriptions on File Prior to Visit  Medication Sig Dispense Refill  . acetaminophen (TYLENOL) 325 MG tablet Take 650 mg by mouth as needed for mild pain or moderate pain.    Marland Kitchen albuterol (PROAIR HFA) 108 (90 BASE) MCG/ACT inhaler Inhale 2 puffs into the lungs every 4 (four) hours as needed for wheezing or shortness of breath. 1 Inhaler 0  . atorvastatin (LIPITOR) 40 MG tablet TAKE 1 TABLET (40 MG TOTAL) BY MOUTH DAILY. 90 tablet 3  . dabigatran (PRADAXA) 150 MG CAPS capsule TAKE 1 CAPSULE (150 MG TOTAL) BY MOUTH EVERY 12 (TWELVE) HOURS. 180 capsule 3  . ferrous sulfate 325 (65 FE) MG tablet Take 325 mg by mouth daily.      . furosemide (LASIX) 20 MG tablet TAKE 1 TABLET (20 MG TOTAL) BY MOUTH DAILY. 90 tablet 3  . gabapentin (NEURONTIN) 300 MG capsule Take 1 capsule (300 mg total) by mouth 2 (two) times daily. 60 capsule prn  . ipratropium (ATROVENT) 0.03 % nasal spray Place 1 spray into both nostrils at bedtime. 30 mL 2  . metoprolol succinate (TOPROL-XL) 50 MG 24 hr tablet Take 25 mg by mouth daily.     . Multiple Vitamin (MULTI-VITAMIN PO) Take 1 tablet by mouth daily.     . OXYGEN Inhale 2 L into the lungs continuous.    . SYMBICORT 160-4.5 MCG/ACT inhaler TAKE 2 PUFFS TWICE A DAY 10.2 Inhaler 2  . tiotropium (SPIRIVA  HANDIHALER) 18 MCG inhalation capsule Place 1 capsule (18 mcg total) into inhaler and inhale daily. 30 capsule 6   No current facility-administered medications on file prior to visit.    No Known Allergies  Past Medical History  Diagnosis Date  . HTN (hypertension)   . Hypercholesterolemia   . Sleep apnea   . COPD (chronic obstructive pulmonary disease) ([place])   . Coronary artery disease     post CABG x4 in June, 2001  . Tachy-brady syndrome ([place])     a. s/p MDT dual chamber PPM  . Peripheral neuropathy ([place])   . Pulmonary HTN ([place])   . Right heart failure [facility])     Past Surgical History  Procedure Laterality Date  . Coronary artery bypass graft  10/04/1999    x4 -- LIMA graft to LAD, saphenous vein graft to the diagonal, saphenous vein graft to the first OM vessel, and saphenous vein graft to the distal RCA  . Cardiac catheterization  10/03/1999    Est. EF of 65% -- Severe left main and two-vessel obstructive atherosclerotic coronary  -- Diffuse ectasia of the right coronary artery -- Normal left ventricular function  . Right knee surgery    . Ep implantable device N/A 10/08/2014    MDT dual chamber PPM  gen change by Dr Caryl Comes    History  Smoking status  . Former Smoker -- 1.00 packs/day for 32 years  . Types: Cigarettes  . Quit date: 11/26/1982  Smokeless tobacco  . Former Systems developer  . Types: Chew  . Quit date: 11/26/2002    Comment: quit smoking 31 yrs ago    History  Alcohol Use No    Family History  Problem Relation Age of Onset  . Heart attack Father   . Heart attack Mother 44    and has had previous angioplasty    Review of Systems: As noted in history of present illness.   All other systems were reviewed and are negative.  Physical Exam: BP 130/74 mmHg  Pulse 88  Ht 5\' 8"  (1.727 m)  Wt 77.656 kg (171 lb 3.2 oz)  BMI 26.04 kg/m2 He is a pleasant white male in no acute distress. His HEENT exam is unremarkable. He has prominent  JV pulsations without  bruits. Lungs are clear. Cardiac exam reveals a regular rate and rhythm with a grade [id number] systolic murmur in the apex. Abdomen is soft and nontender without masses or bruits. Extremities are with trace edema.. Pedal pulses are good. His neurologic exam is nonfocal.  LABORATORY DATA: Lab Results  Component Value Date   WBC 7.6 10/01/2014   HGB 16.1 10/01/2014   HCT 48.4 10/01/2014   PLT 166.0 10/01/2014   GLUCOSE 120* 10/01/2014   CHOL 127 04/23/2012   TRIG 98.0 04/23/2012   HDL 43.70 04/23/2012   LDLDIRECT 200.1 01/13/2012   LDLCALC 64 04/23/2012   ALT 32 09/25/2014   AST 43* 09/25/2014   NA 139 10/01/2014   K 4.1 10/01/2014   CL 99 10/01/2014   CREATININE 1.00 10/01/2014   BUN 30* 10/01/2014   CO2 34* 10/01/2014   TSH 4.67 04/15/2013     Echo: 09/26/14:Study Conclusions  - Left ventricle: The cavity size was normal. Wall thickness was increased in a pattern of mild LVH. The estimated ejection fraction was 60%. - Aortic valve: Sclerosis without stenosis. There was no significant regurgitation. - Left atrium: The atrium was moderately to severely dilated. - Right ventricle: There is dilitation of the RV. Pacer is in place. Systolic function was moderately reduced. - Pulmonary arteries: PA peak pressure: 75 mm Hg (S).  Assessment / Plan: 1. Coronary disease status post CABG in 2001. Patient had a  Myoview study in June 2013 which showed no ischemia and normal ejection fraction. He has no symptoms of chest pain. We will continue with his current medical therapy.  2. Atrial fibrillation. He is on chronic anticoagulation. His rate control appears acceptable. He is status post pacemaker implant for bradycardia  3. Hyperlipidemia. On statin.  4. COPD- progressive now on home oxygen.  5. Hypertension, controlled.  6. Right heart failure with cor pulmonale and severe pulmonary HTN secondary to #4. Continue current lasix dose. Stressed importance of sodium  restriction.  I will follow up in 6 months.

## 2015-03-12 NOTE — Patient Instructions (Signed)
Continue your current therapy  I will see you in 6 months.   

## 2015-03-24 ENCOUNTER — Encounter: Payer: Self-pay | Admitting: Cardiology

## 2015-05-05 ENCOUNTER — Other Ambulatory Visit: Payer: Self-pay | Admitting: Pulmonary Disease

## 2015-05-15 ENCOUNTER — Ambulatory Visit: Payer: Medicare Other | Admitting: Pulmonary Disease

## 2015-05-18 ENCOUNTER — Ambulatory Visit (INDEPENDENT_AMBULATORY_CARE_PROVIDER_SITE_OTHER): Payer: Medicare Other | Admitting: Pulmonary Disease

## 2015-05-18 VITALS — BP 122/78 | HR 64 | Temp 97.5°F | Ht 69.0 in | Wt 175.6 lb

## 2015-05-18 DIAGNOSIS — I2510 Atherosclerotic heart disease of native coronary artery without angina pectoris: Secondary | ICD-10-CM

## 2015-05-18 DIAGNOSIS — I481 Persistent atrial fibrillation: Secondary | ICD-10-CM

## 2015-05-18 DIAGNOSIS — I2781 Cor pulmonale (chronic): Secondary | ICD-10-CM

## 2015-05-18 DIAGNOSIS — Z950 Presence of cardiac pacemaker: Secondary | ICD-10-CM

## 2015-05-18 DIAGNOSIS — I4819 Other persistent atrial fibrillation: Secondary | ICD-10-CM

## 2015-05-18 DIAGNOSIS — J432 Centrilobular emphysema: Secondary | ICD-10-CM | POA: Diagnosis not present

## 2015-05-18 DIAGNOSIS — J9611 Chronic respiratory failure with hypoxia: Secondary | ICD-10-CM

## 2015-05-18 DIAGNOSIS — G4733 Obstructive sleep apnea (adult) (pediatric): Secondary | ICD-10-CM

## 2015-05-18 NOTE — Patient Instructions (Signed)
Today we updated your med list in our EPIC system...    Continue your current medications the same...  Continue your Oxygen, CPAP, Symbicort & Spiriva...  Call for any questions...  Let's plan a follow up visit in 15mo, sooner if needed for breathing problems...    We will send a copy of your record to your PCP in [place]...    We will arrange for a 2DEchocardiogram to be done on the day your return in June 2017.Marland KitchenMarland Kitchen

## 2015-05-19 ENCOUNTER — Telehealth: Payer: Self-pay | Admitting: Pulmonary Disease

## 2015-05-19 ENCOUNTER — Encounter: Payer: Self-pay | Admitting: Pulmonary Disease

## 2015-05-19 NOTE — Progress Notes (Signed)
Subjective:     Patient ID: Roy Banks, male   DOB: [birth year], 82 y.o.   MRN: [id number]  HPI ~  October 10, 2014:  Initial pulmonary consult by SN>  15 y/o WM, referred by [facility] for on-going pulmonary evaluation & treatment> his PCP is Dr. Claris Gower; medical hx includes DM w/ neuropathy...      He was prev seen by DrWert, last 12/14> former smoker, quit 1984, followed for COPD w/ CTChest 2014 showing centrilobular emphysema & prev PFTs w/ mod airflow obstruction;  He's had cough, beige sput, wheezing, DOE and treated w/ Symbicort160-2spBid, Spiriva daily, ProventilHFA as needed... He was referred to pulmonary rehab but he never participated... He receives occas Pred tapers during acute exac;        Followed by Kallie Edward for OSA, last 6/15> on CPAP x47yrs, good compliance, uses Klonopin for RLS;        ADM by [facility] 6[facility]2 - 09/27/14 w/ hypoxemia & COPD exac, incr SOB but denied cough/ sput/ hemoptysis; found to have right heart failure/ PulmHTN;  Treated w/ Pred, Doxy, Oxygen started this adm, & continuation of Symbicort160, Spiriva, Proair;  He was diuresed w/ Lasix & asked to f/u w/ Cards to consider right heart cath...       Cardiology eval by DrKlein, seen 10/07/14>  Pacer implanted 2009 for tachybrady syndrome, persistent AFib on Pradaxa, hx CAD- s/pCABG, CHF w/ BNP=1150 & pedal edema; Myoview 2013 was neg for ischemia w/ EF=65%;  2DEcho 6/16 showed mildLVH, norm LVF w/ EF=60%, AVsclerosis w/o stenosis, mod to severe LAdil, RVdil w/ mod reduction in RV function & est PAsys~17mmHg... Pacer generator changed 10/08/14 by DrKlein...  EXAM showed Afeb, VSS, O2sat=91% on O2 at 2L/min;  HEENT- neg, mallampati2;  Chest- decr BS but w/o w/r/r;  Heart- pacer, RR, gr1/6SEM w/o r/g;  Abd- soft, neg;  Ext- trace edema w/o c/c...  Sleep study 12/[facility] at [facility] reported mild OSA w/ RDI=9/hr, loud snoring, PLMS=5.5/hr, & lowest O2sat=91% on RA... Started on CPAP- details unknown, & Klonopin for  RLS...  CTChest 08/28/12 showed borderline heart size, no signif adenopathy, modHH, atherosclerosis in Ao, coronaries (including [place]), & s/pCABG; mod to severe centrilobular emphysema, mild biapical pleuroparenchymal thickening, no other lung lesions; large right renal cyst...   CXR 09/25/14 showed s/pCABG w/ pacer in place, trace bilat effus, hyperinflated w/ incr markings, NAD...   2DEcho 6/16 showed mildLVH, norm LVF w/ EF=60%, AVsclerosis w/o stenosis, mod to severe LAdil, RVdil w/ mod reduction in RV function & est PAsys~72mmHg  LABS 6/16 showed elevBS, BNP=1150  Ambulatory oxygen saturation test> On 2L/min his O2sat=98% w/ pulse=61/min;  Walked 1 lap w/ O2sat=91% w/ pulse=76/min... IMP/PLAN>>   Braidyn has COPD/emphysema on Symbicort, Spiriva, ProairHFA (he finished prev Doxy, Pred);  OSA on CPAP & we will contact [employer] for machine download data;  CorPulmonale w/ right heart failure and pulm art HTN=> on O2 at 2L/min 24/7 now & continue Lasix20...  ADDENDUM>> CPAP download date from [employer]> 5/6 - 09/27/14=> CPAP=7, 28/30 days, 7-8h per night, AHI=2.0; good compliance & effectiveness confirmed...    ~  November 12, 2014:  29mo OV w/ SN>   Roy Banks has moved to [place] he tells me that he still prefers to get his medical care here in [place];  His breathing is OK on his O2 at 2L/min 24/7 via Hue[facility] Romans- it is bled into his CPAP at night & using bottles during the day- he needs pulse dose set up & may  be better served w/ POC so we will inquire on his behalf...     COPD/emphysema> on Symbicort160-2spBid, Spiriva daily, ProairHFA prn (off prev Doxy, Pred); CT 2014 w/ mod to severe centrilob emphysema    OSA on CPAP> download from [place] 5/16 showed CPAP=7 (he says 5), good compliance & effectiveness confirmed w/ AHI=2...    Right heart failure and pulmonary HTN> 2DEcho 6/16 w/ RVdil & mod reduced RV function & PAsys est~13mmHg; started on O2 and diuresed w/ Lasix20; DrKlein incr to Lasix20Bid transiently...     CAD- s/p CABG> followed by DrJordan for Cards on MetopER50-1/2, Lasix20/d; last OV 10/2013 & due for yearly check up soon...    AFib,Hx tachy-brady w/ pacer> on Pradaxa & followed by DrKlein; generator changed 10/08/14 & doing satis, he does tele-pacer checks monthly...    DM w/ neuropathy> on diet alone for his DM (BS in hosp 120-160, no A1c in Epic); on Neurontin300Qhs...     Hiatus hernia> aware, not currently on meds, denies reflux, heartburn, dysphagia, n/v, d/c, blood seen...       Renal cyst EXAM shows Afeb, VSS, O2sat=92% on 2l/min at rest;  HEENT- neg, mallampati2;  Chest- decr BS but w/o w/r/r;  Heart- pacer, RR, gr1/6SEM w/o r/g;  Abd- soft, neg;  Ext- trace edema w/o c/c... IMP/PLAN>>  No labs done today, he has upcoming appt w/ DrJordan, Lasix20 refilled & reminded NO SALT, we will arrange for ONO on CPAP/ on O2 to confirm no hypoxemia at night, needs pulse dose set up & he would benefit from POC;  Plan ROV 51mo, sooner prn... ADDENDUM>>  ONO done 12/20/14 on CPAP & on O2 at 2L/min showed resolution of the nocturnal hypoxemia- mean O2sat=94.7%, time <88% was 58min36sec (0.4% of the recording) and he was >90% for 99.5%; continue same.   ~  January 13, 2015:  15mo ROV w/ SN>   [name] returns and reports a stable 66mo interval- he remains on O2at 2L/min, Symbicort160-2spBid, Spriva daily, AlbutHFA as needed; he notes that he is not resting as well recently & blames a noise his CPAP machine makes now that the oxygen is bled-in; he also has c/o mask fit => we will ask [employer] to check his machine & mask...  Roy Banks also notes right leg pain (esp at night) & he was taking Gabapentin 300mg Qhs but wondered if this was the cause so he stopped it; we discussed this & noted how this is a low dose for neuropathy problems asking him to try increasing the medication to 2 tabsQhs; wife notes more of a prob from RLS by her description...     He remains on ToprolXL25, Lasix20, Pradaxa150Bid; CAD, s/p CABG, AFib, hx  tachy-brady w/ pacer, right heart failure & PulmHTN...    He had f/u DrJordan for Cards 8/16> note reviewed, they stressed sodium restriction and continued the same meds...    He also had a f/u appt w/ DrKlein 8/16> note reviewed, normal pacer function, rec to continue same meds & teletrace...  EXAM shows Afeb, VSS, O2sat=95% on 2l/min at rest;  HEENT- neg, mallampati1;  Chest- decr BS but w/o w/r/r;  Heart- pacer, irreg, gr1/6SEM w/o r/g;  Abd- soft, neg;  Ext- trace edema w/o c/c... IMP/PLAN>>  We discussed his medications and the only rec change today is to try incr the Gabapentin from 300mg Qhs to 600gmQhs & see if we can diminish his nocturnal leg discomfort; he will also restart an exercise program rec by a former chiro to help  his back & legs...   ~  May 18, 2015:  20mo ROV w/SN>   Hadyn continues to come from [facility] to [place] for his pulmonary follow-up- he has a new PCP Dr. Magdalene Molly in [place], [place] (copy of this summary note sent to him)...  Pt reports that he uses CPAP 7-10 Hrs per night & breathing is stable, he says [employer] sent him a note indicating that he needs to have 2 tests done but he left the letter at home 7 dosen't known what they are- we did not receive a similar note from them; he is asked to CALL us once he gets home & retreived the letter...     Prophet had one interval visit- CARDS f/u DrJordan 03/02/15>  Followed for CAD- CABG2001, AFib & tachy-brady w/ pacemaker 2009 & generator revision 6/16;  MYOVIEW 09/2011 was NEG;  MEDS+ Pradaxa150Bid, MetoprololER25, Lasix20;  He has PulmHTN on 2DEcho (last 6/16 PAsys=75)- on Home O2; no change in meds...    COPD/emphysema> on Symbicort160-2spBid, Spiriva daily, ProairHFA prn (not often); CT 2014 w/ mod to severe centrilob emphysema; breathing stable on meds...    OSA on CPAP> download from [place] 5/16 showed CPAP=7 (he says 5), good compliance & effectiveness confirmed w/ AHI=2; he needs to keep up w/ mask changes and machine  checks thru [facility]...    Right heart failure and pulmonary HTN> 2DEcho 6/16 w/ RVdil & mod reduced RV function & PAsys est~26mmHg; started on O2 and diuresed w/ Lasix20; DrKlein incr to Lasix20Bid transiently.     CAD- s/p CABG> followed by DrJordan for Cards on MetopER50-1/2, Lasix20/d; last OV 02/2015 as above 7 no changes made.    AFib,Hx tachy-brady w/ pacer> on Pradaxa & followed by DrKlein; generator changed 10/08/14 & doing satis, he does tele-pacer checks monthly...    DM w/ neuropathy> on diet alone for his DM (BS in hosp 120-160, no A1c in Epic); on Neurontin300Bid now & improved...     Hiatus hernia> aware, not currently on meds, denies reflux, heartburn, dysphagia, n/v, d/c, blood seen...       Renal cyst> aware, Cr is wnl at 1.0-1.1.Marland KitchenMarland Kitchen EXAM shows Afeb, VSS, O2sat=90% on 2l/min at rest;  HEENT- neg, mallampati1;  Chest- decr BS but w/o w/r/r;  Heart- pacer, irreg, gr1/6SEM w/o r/g;  Abd- soft, neg;  Ext- trace edema w/o c/c... IMP/PLAN>>  Zadiel will work w/ his DME at [employer] regarding mask fit & machine function... He needs to incr exercise program as if in [place] program... Same meds and we will plan f/u 2DEcho prior to his ROV...   ADDENDUM>>  2DEcho done 08/18/15>> norm LV size, LVF, & wall thickness w/ EF=55-60% (no regional wall motion abn); Gr2DD, AoV ok, MV- mildMR, mod LA&RA dil, mod RV dil w/ decr RV function, PAsys=32mmHg.    Past Medical History  Diagnosis Date  . HTN (hypertension) >> on MetoprololER50-1/2Qd, Lasix20   . Hypercholesterolemia >> on Lipitor40   . Sleep apnea >> on CPAP followed by DrAlva   . COPD (chronic obstructive pulmonary disease) w/ CorPulmonale- O2 started 09/2014 on 2L/min    . Coronary artery disease >> followed by DrJordan     post CABG x4 in June, 2001  . Tachy-brady syndrome   . Atrial fibrillation >> on Pradaxa 150Bid     post DDD pacemaker implant >> followed by DrKlein  . Peripheral neuropathy >> on Neurontin 300Qhs, Klonopin (off now)      Past Surgical History  Procedure Laterality Date  . Coronary artery bypass graft  10/04/1999    x4 -- LIMA graft to LAD, saphenous vein graft to the diagonal, saphenous vein graft to the first OM vessel, and saphenous vein graft to the distal RCA  . Cardiac catheterization  10/03/1999    Est. EF of 65% -- Severe left main and two-vessel obstructive atherosclerotic coronary  -- Diffuse ectasia of the right coronary artery -- Normal left ventricular function  . Right knee surgery    . Ep implantable device N/A 10/08/2014    MDT dual chamber PPM gen change by Dr Caryl Comes    Outpatient Encounter Prescriptions as of 05/18/2015  Medication Sig  . acetaminophen (TYLENOL) 325 MG tablet Take 650 mg by mouth as needed for mild pain or moderate pain.  Marland Kitchen albuterol (PROAIR HFA) 108 (90 BASE) MCG/ACT inhaler Inhale 2 puffs into the lungs every 4 (four) hours as needed for wheezing or shortness of breath.  Marland Kitchen atorvastatin (LIPITOR) 40 MG tablet TAKE 1 TABLET (40 MG TOTAL) BY MOUTH DAILY.  . dabigatran (PRADAXA) 150 MG CAPS capsule TAKE 1 CAPSULE (150 MG TOTAL) BY MOUTH EVERY 12 (TWELVE) HOURS.  . ferrous sulfate 325 (65 FE) MG tablet Take 325 mg by mouth daily.    . furosemide (LASIX) 20 MG tablet TAKE 1 TABLET (20 MG TOTAL) BY MOUTH DAILY.  Marland Kitchen gabapentin (NEURONTIN) 300 MG capsule Take 1 capsule (300 mg total) by mouth 2 (two) times daily.  Marland Kitchen ipratropium (ATROVENT) 0.03 % nasal spray Place 1 spray into both nostrils at bedtime.  . metoprolol succinate (TOPROL-XL) 50 MG 24 hr tablet Take 25 mg by mouth daily.   . Multiple Vitamin (MULTI-VITAMIN PO) Take 1 tablet by mouth daily.   . OXYGEN Inhale 2 L into the lungs continuous.  . SYMBICORT 160-4.5 MCG/ACT inhaler TAKE 2 PUFFS TWICE A DAY  . tiotropium (SPIRIVA HANDIHALER) 18 MCG inhalation capsule Place 1 capsule (18 mcg total) into inhaler and inhale daily.   No facility-administered encounter medications on file as of 05/18/2015.    No Known  Allergies   Immunization History  Administered Date(s) Administered  . Influenza Split 01/23/2013, 01/21/2015  . Influenza,inj,Quad PF,36+ Mos 04/03/2014    Current Medications, Allergies, Past Medical History, Past Surgical History, Family History, and Social History were reviewed in [employer] record.   Review of Systems            All symptoms NEG except where BOLDED >>  Constitutional:  F/C/S, fatigue, anorexia, unexpected weight change. HEENT:  HA, visual changes, hearing loss, earache, nasal symptoms, sore throat, mouth sores, hoarseness. Resp:  cough, sputum, hemoptysis; SOB, tightness, wheezing. Cardio:  CP, palpit, DOE, orthopnea, edema. GI:  N/V/D/C, blood in stool; reflux, abd pain, distention, gas. GU:  dysuria, freq, urgency, hematuria, flank pain, voiding difficulty. MS:  joint pain, swelling, tenderness, decr ROM; neck pain, back pain, etc. Neuro:  HA, tremors, seizures, dizziness, syncope, weakness, numbness, gait abn. Skin:  suspicious lesions or skin rash. Heme:  adenopathy, bruising, bleeding. Psyche:  confusion, agitation, sleep disturbance, hallucinations, anxiety, depression suicidal.   Objective:   Physical Exam      Vital Signs:  Reviewed...   General:  WD, WN, 82 y/o WM in NAD; alert & oriented; pleasant & cooperative... HEENT:  [place]/AT; Conjunctiva- pink, Sclera- nonicteric, EOM-wnl, PERRLA, EACs-clear, TMs-wnl; NOSE-clear; THROAT-clear & wnl.  Neck:  Supple w/ fair ROM; no JVD; normal carotid impulses w/o bruits; no thyromegaly or nodules palpated; no  lymphadenopathy.  Chest:  decr BS bilat; without wheezes, rales, or rhonchi heard. Heart:  irreg; pacer, without murmurs, rubs, or gallops detected. Abdomen:  Soft & nontender- no guarding or rebound; normal bowel sounds; no organomegaly or masses palpated. Ext:  Normal ROM; without deformities or arthritic changes; no varicose veins, venous insuffic, or edema;  Pulses intact w/o  bruits. Neuro:  No focal neuro deficits; gait normal & balance OK. Derm:  No lesions noted; no rash etc. Lymph:  No cervical, supraclavicular, axillary, or inguinal adenopathy palpated.   Assessment:      IMP >>    COPD/emphysema ==> continue inhalers (Symbicort160, Spiriva), O2 at 2L/min, exercise...    OSA on CPAP ==> continue CPAP & O2 at 2L/min 24/7 & ONO check was ok...    Right heart failure and pulmonary HTN ==> continue Lasix20 & O2; plan f/u BNP, 2DEcho later...    CAD- s/p CABG => per DrJordan    AFib, Hx tachy-brady w/ pacer => per DrKlein w/ recent generator change    DM w/ neuropathy => on Gabapentin300Qhs    Hiatus hernia    Renal cyst  PLAN >>  10/10/14>   Cayman has COPD/emphysema on Symbicort, Spiriva, ProairHFA (he finished prev Doxy, Pred);  OSA on CPAP & we will contact [employer] for machine download data;  CorPulmonale w/ right heart failure and pulm art HTN=> on O2 at 2L/min 24/7 now & continue Lasix20...  01/13/15>   We discussed his medications and the only rec change today is to try incr the Gabapentin from 300mg Qhs to 600gmQhs & see if we can diminish his nocturnal leg discomfort; he will also restart an exercise program rec by a former chiro to help his back & legs... 05/18/15>   Grayden will work w/ his DME at [employer] regarding mask fit & machine function... He needs to incr exercise program as if in [place] program... Same meds and we will plan f/u 2DEcho prior to his ROV...      Plan:     Patient's Medications  New Prescriptions   No medications on file  Previous Medications   ACETAMINOPHEN (TYLENOL) 325 MG TABLET    Take 650 mg by mouth as needed for mild pain or moderate pain.   ALBUTEROL (PROAIR HFA) 108 (90 BASE) MCG/ACT INHALER    Inhale 2 puffs into the lungs every 4 (four) hours as needed for wheezing or shortness of breath.   ATORVASTATIN (LIPITOR) 40 MG TABLET    TAKE 1 TABLET (40 MG TOTAL) BY MOUTH DAILY.   DABIGATRAN (PRADAXA) 150 MG CAPS CAPSULE     TAKE 1 CAPSULE (150 MG TOTAL) BY MOUTH EVERY 12 (TWELVE) HOURS.   FERROUS SULFATE 325 (65 FE) MG TABLET    Take 325 mg by mouth daily.     FUROSEMIDE (LASIX) 20 MG TABLET    TAKE 1 TABLET (20 MG TOTAL) BY MOUTH DAILY.   GABAPENTIN (NEURONTIN) 300 MG CAPSULE    Take 1 capsule (300 mg total) by mouth 2 (two) times daily.   IPRATROPIUM (ATROVENT) 0.03 % NASAL SPRAY    Place 1 spray into both nostrils at bedtime.   METOPROLOL SUCCINATE (TOPROL-XL) 50 MG 24 HR TABLET    Take 25 mg by mouth daily.    MULTIPLE VITAMIN (MULTI-VITAMIN PO)    Take 1 tablet by mouth daily.    OXYGEN    Inhale 2 L into the lungs continuous.   SYMBICORT 160-4.5 MCG/ACT INHALER    TAKE 2  PUFFS TWICE A DAY   TIOTROPIUM (SPIRIVA HANDIHALER) 18 MCG INHALATION CAPSULE    Place 1 capsule (18 mcg total) into inhaler and inhale daily.  Modified Medications   No medications on file  Discontinued Medications   No medications on file

## 2015-05-19 NOTE — Telephone Encounter (Signed)
Spoke with pt's wife. States that pt's OV note from yesterday needs to be faxed to the number she provided. Advised her that we would be glad to fax this note once SN completes it. Will route to [place] to fax once SN completes the note.

## 2015-05-23 ENCOUNTER — Other Ambulatory Visit: Payer: Self-pay | Admitting: Cardiology

## 2015-05-25 NOTE — Telephone Encounter (Signed)
Dr. Jeannine Kitten notes printed from last OV and faxed to [place] at fax provided by patient's wife. Nothing further needed.

## 2015-05-25 NOTE — Telephone Encounter (Signed)
Rx request sent to pharmacy.  

## 2015-05-28 ENCOUNTER — Telehealth: Payer: Self-pay | Admitting: Pulmonary Disease

## 2015-05-28 NOTE — Telephone Encounter (Signed)
error 

## 2015-05-29 ENCOUNTER — Telehealth: Payer: Self-pay | Admitting: Pulmonary Disease

## 2015-05-29 NOTE — Telephone Encounter (Signed)
Called spoke with spouse. She is needing pt OV notes faxed to [facility] that date back to July 2016. She gave fax # to fax this to [phone] I have faxed this over to the number provided. Nothing further needed

## 2015-06-04 ENCOUNTER — Ambulatory Visit (INDEPENDENT_AMBULATORY_CARE_PROVIDER_SITE_OTHER): Payer: Medicare Other | Admitting: *Deleted

## 2015-06-04 DIAGNOSIS — I495 Sick sinus syndrome: Secondary | ICD-10-CM

## 2015-06-04 NOTE — Progress Notes (Signed)
Remote pacemaker transmission.   

## 2015-06-28 ENCOUNTER — Encounter: Payer: Self-pay | Admitting: Cardiology

## 2015-06-28 LAB — CUP PACEART REMOTE DEVICE CHECK
Battery Remaining Longevity: 146 mo
Battery Voltage: 2.79 V
Date Time Interrogation Session: 20170209135551
Implantable Lead Implant Date: 20091218
Implantable Lead Implant Date: 20091218
Implantable Lead Location: 753860
Implantable Lead Model: 5076
Implantable Lead Model: 5076
Lead Channel Impedance Value: 589 Ohm
Lead Channel Impedance Value: 67 Ohm
Lead Channel Setting Sensing Sensitivity: 5.6 mV
MDC IDC LEAD LOCATION: 753859
MDC IDC MSMT BATTERY IMPEDANCE: 112 Ohm
MDC IDC SET LEADCHNL RV PACING AMPLITUDE: 2.5 V
MDC IDC SET LEADCHNL RV PACING PULSEWIDTH: 0.4 ms
MDC IDC STAT BRADY RV PERCENT PACED: 94 %

## 2015-06-28 NOTE — Progress Notes (Signed)
Normal remote reviewed.  Next [facility] 09/03/15

## 2015-06-29 ENCOUNTER — Other Ambulatory Visit: Payer: Self-pay | Admitting: Pulmonary Disease

## 2015-07-02 ENCOUNTER — Other Ambulatory Visit: Payer: Self-pay | Admitting: Pulmonary Disease

## 2015-07-02 ENCOUNTER — Telehealth: Payer: Self-pay | Admitting: Pulmonary Disease

## 2015-07-02 MED ORDER — TIOTROPIUM BROMIDE MONOHYDRATE 18 MCG IN CAPS: ORAL_CAPSULE | RESPIRATORY_TRACT | Status: DC

## 2015-07-02 MED ORDER — BUDESONIDE-FORMOTEROL FUMARATE 160-4.5 MCG/ACT IN AERO: INHALATION_SPRAY | RESPIRATORY_TRACT | Status: DC

## 2015-07-02 NOTE — Telephone Encounter (Addendum)
Called and spoke to pt. Pt states he is needing a new rx for his CPAP machine. Called [facility] and spoke to [place] and was advised they are needing a current CPAP rx on file for the pt. Lynn Ito states she would fax over form for SN to sign. Called and spoke to pt to make him aware. Pt verbalized understanding.   Will hold in my box to follow.

## 2015-07-02 NOTE — Telephone Encounter (Signed)
Received faxed refill request from [facility] for Symbicort 160 and Spiriva Handihaler Pt last seen 1.23.17, follow up in 6 months Refills sent

## 2015-07-02 NOTE — Telephone Encounter (Signed)
LMTCB

## 2015-07-06 NOTE — Telephone Encounter (Signed)
Elise, did you receive this form? 

## 2015-07-06 NOTE — Telephone Encounter (Signed)
No, I have not received this. Called [facility] and spoke to [place] and requested she re-fax the form. Will await form.

## 2015-07-07 NOTE — Telephone Encounter (Signed)
I have checked up front and SN's look at. This form is not in either location. Daneil Dan do you have this form with you or do we need to request this again?

## 2015-07-08 NOTE — Telephone Encounter (Signed)
No this has not been received. This is not up front nor is it in SN's look-ats. Will need to re-request and assure they are receiving a confirmation the fax is going through - if so then may need to get to fax to allergy fax. Will call [employer] today.

## 2015-07-08 NOTE — Telephone Encounter (Signed)
Called and spoke to [place] with [facility] and re-requested the form. Arbie Cookey states she will re-fax the form. Will await the form.

## 2015-07-09 IMAGING — DX DG CHEST 2V
2 series · 2 of 2 positions shown · non-contrast
Comparison: PA and lateral chest 04/15/2013 and 04/03/2014.

CLINICAL DATA: Shortness of breath. History of COPD and heart
disease.

EXAM:
CHEST  2 VIEW

[chest pa]
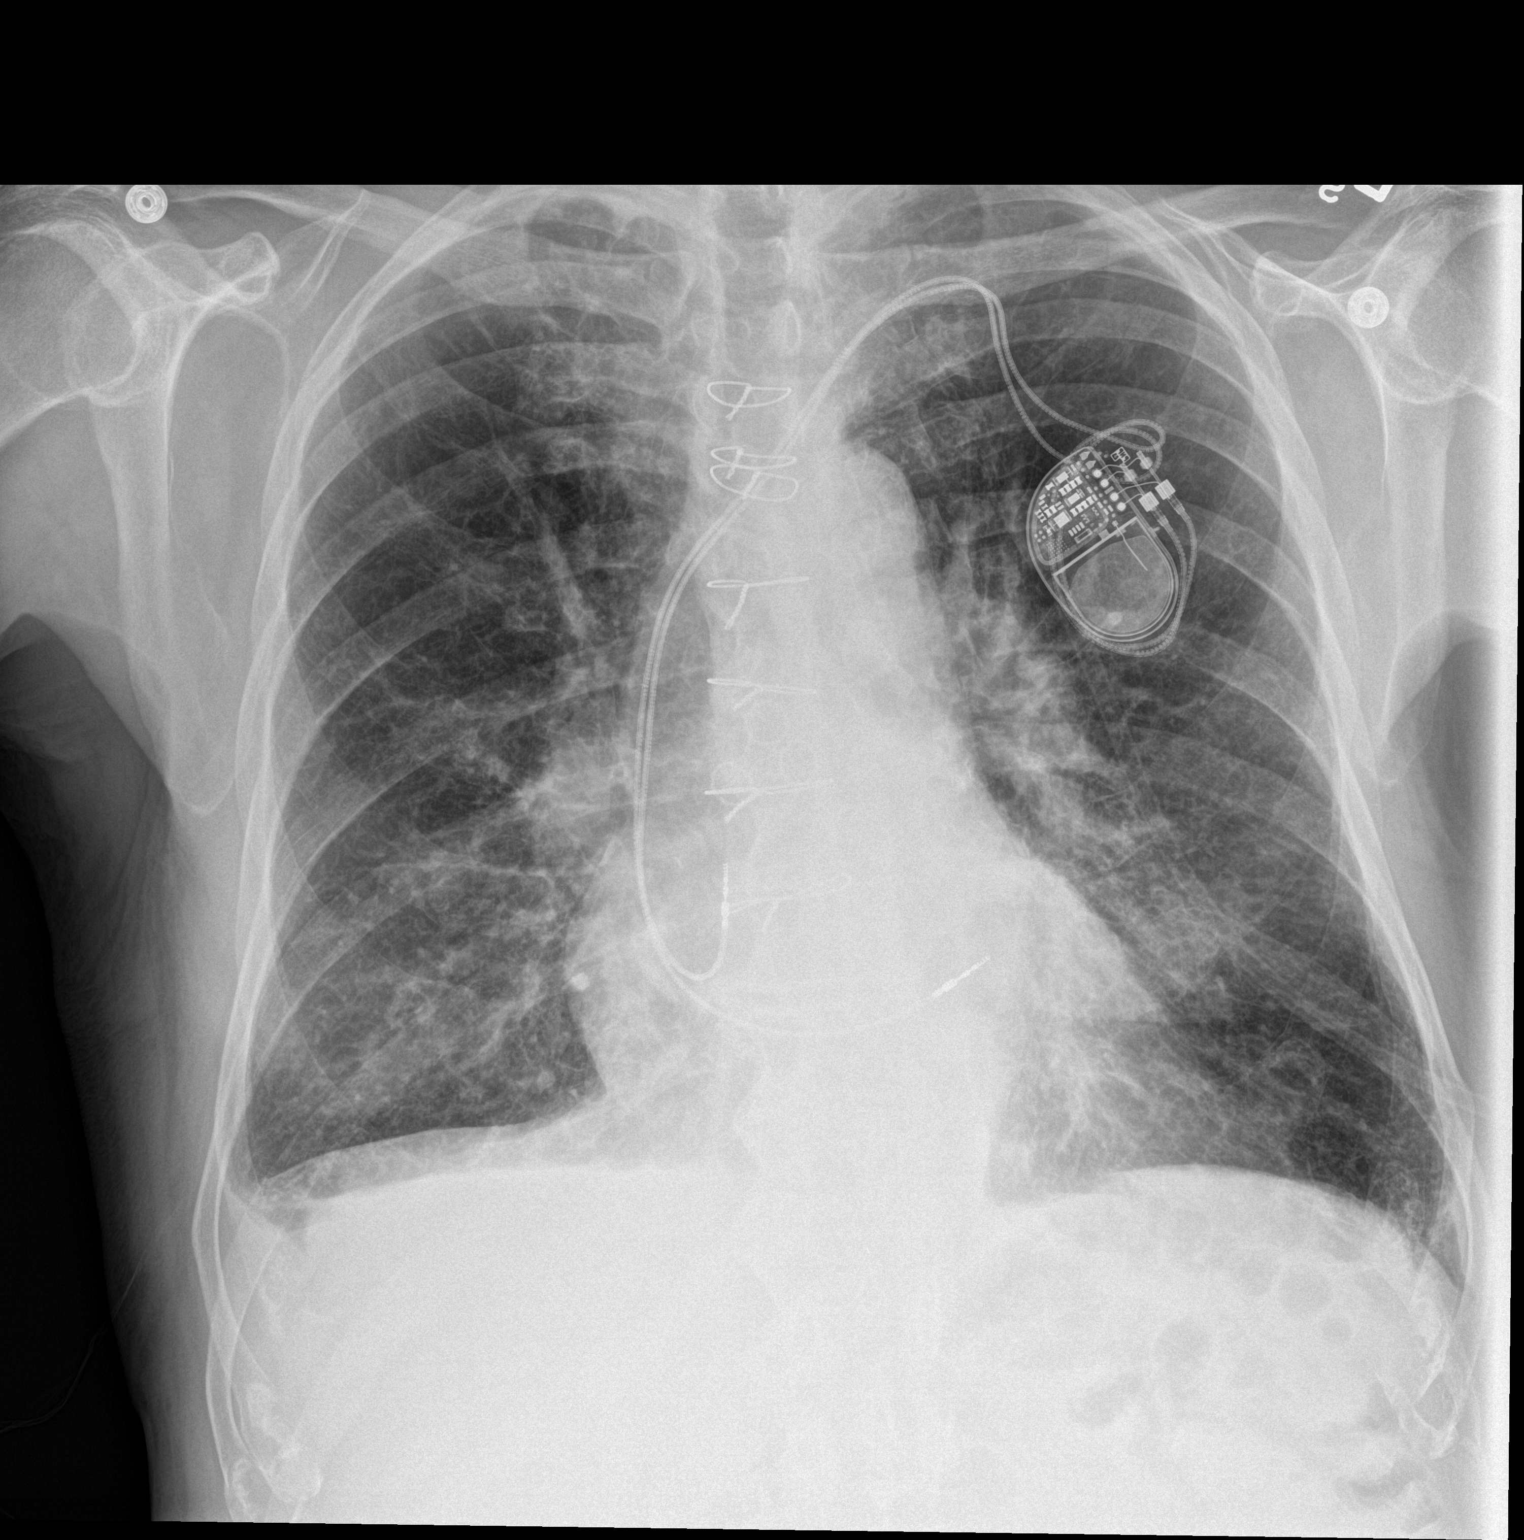

[chest lat]
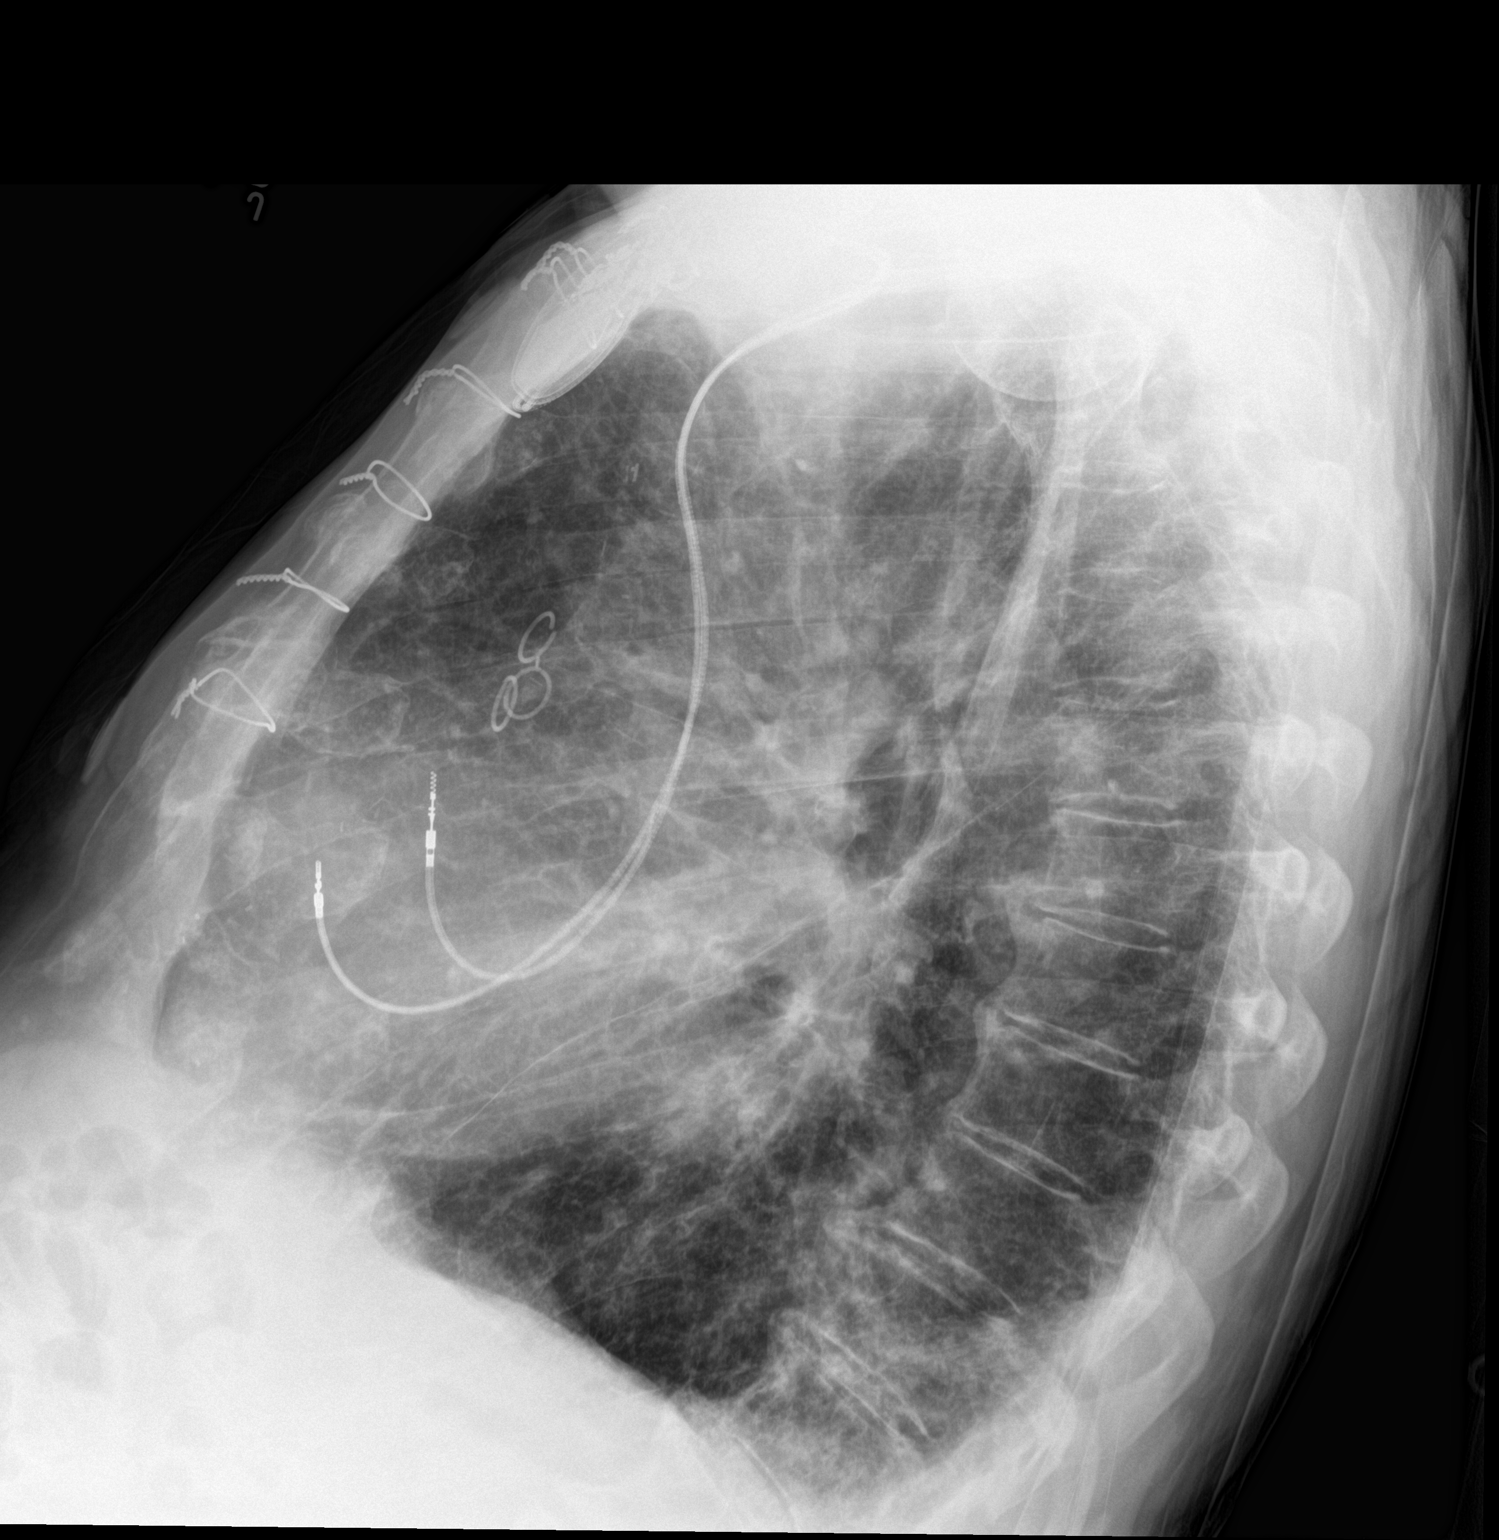

[2 of 2 positions shown; findings below may reference images not displayed]

FINDINGS: The patient is status post CABG with a pacing device in place. The
chest is hyperexpanded consistent with history of COPD. Trace
bilateral pleural effusions are identified. Coarsened appearance of
the pulmonary interstitium is chronic. No consolidative process or
pneumothorax identified.
IMPRESSION: Trace bilateral pleural effusions, greater on the right.

Emphysema.

Negative for pneumonia or pulmonary edema.

## 2015-07-10 NOTE — Telephone Encounter (Signed)
Daneil Dan please advise if form has been received, thanks

## 2015-07-10 NOTE — Telephone Encounter (Signed)
Yes, this has been received and placed in SN's look-ats for signature. Will update chart when returned back to me.

## 2015-07-14 DIAGNOSIS — H2513 Age-related nuclear cataract, bilateral: Secondary | ICD-10-CM | POA: Diagnosis not present

## 2015-07-14 DIAGNOSIS — H524 Presbyopia: Secondary | ICD-10-CM | POA: Diagnosis not present

## 2015-07-14 DIAGNOSIS — H5202 Hypermetropia, left eye: Secondary | ICD-10-CM | POA: Diagnosis not present

## 2015-07-14 DIAGNOSIS — H25041 Posterior subcapsular polar age-related cataract, right eye: Secondary | ICD-10-CM | POA: Diagnosis not present

## 2015-07-15 ENCOUNTER — Encounter: Payer: Self-pay | Admitting: Cardiology

## 2015-07-15 NOTE — Telephone Encounter (Signed)
Daneil Dan please advise if form has been signed/completed. Thanks!

## 2015-07-17 NOTE — Telephone Encounter (Signed)
This has not been signed yet, will re-request signature from SN. Thanks.

## 2015-07-20 NOTE — Telephone Encounter (Signed)
Roy Banks - please advise if this has been done. Thanks.

## 2015-07-21 NOTE — Telephone Encounter (Signed)
Has SN signed these yet?  Please advise.

## 2015-07-22 ENCOUNTER — Other Ambulatory Visit: Payer: Self-pay | Admitting: Internal Medicine

## 2015-07-22 NOTE — Telephone Encounter (Signed)
Per SN:  This was given to [facility] yesterday (3.28.17).   Spoke with Rodena Piety and advised this was received and faxed back to the DME. Nothing further needed at this time.

## 2015-07-22 NOTE — Telephone Encounter (Signed)
I have not received this back yet. Will update chart when this has been taken care of. Thanks.

## 2015-07-23 ENCOUNTER — Other Ambulatory Visit: Payer: Self-pay | Admitting: Pulmonary Disease

## 2015-08-15 ENCOUNTER — Other Ambulatory Visit: Payer: Self-pay | Admitting: Cardiology

## 2015-08-17 NOTE — Telephone Encounter (Signed)
REFILL 

## 2015-08-18 ENCOUNTER — Ambulatory Visit (HOSPITAL_COMMUNITY): Payer: Medicare Other | Attending: Cardiology

## 2015-08-18 ENCOUNTER — Other Ambulatory Visit: Payer: Self-pay

## 2015-08-18 DIAGNOSIS — I340 Nonrheumatic mitral (valve) insufficiency: Secondary | ICD-10-CM | POA: Insufficient documentation

## 2015-08-18 DIAGNOSIS — I2781 Cor pulmonale (chronic): Secondary | ICD-10-CM | POA: Insufficient documentation

## 2015-08-18 DIAGNOSIS — I2510 Atherosclerotic heart disease of native coronary artery without angina pectoris: Secondary | ICD-10-CM | POA: Diagnosis not present

## 2015-08-18 DIAGNOSIS — I119 Hypertensive heart disease without heart failure: Secondary | ICD-10-CM | POA: Diagnosis not present

## 2015-08-18 DIAGNOSIS — E785 Hyperlipidemia, unspecified: Secondary | ICD-10-CM | POA: Insufficient documentation

## 2015-08-18 DIAGNOSIS — I272 Other secondary pulmonary hypertension: Secondary | ICD-10-CM | POA: Insufficient documentation

## 2015-08-20 ENCOUNTER — Telehealth: Payer: Self-pay | Admitting: Pulmonary Disease

## 2015-08-20 DIAGNOSIS — K922 Gastrointestinal hemorrhage, unspecified: Secondary | ICD-10-CM | POA: Diagnosis not present

## 2015-08-20 NOTE — Telephone Encounter (Signed)
Spoke with pt and advised that I have no record of someone calling from our office.  Pt verbalized understanding and nothing further is needed.

## 2015-08-21 ENCOUNTER — Other Ambulatory Visit: Payer: Self-pay | Admitting: Internal Medicine

## 2015-08-24 DIAGNOSIS — K5791 Diverticulosis of intestine, part unspecified, without perforation or abscess with bleeding: Secondary | ICD-10-CM | POA: Diagnosis not present

## 2015-08-25 ENCOUNTER — Ambulatory Visit (INDEPENDENT_AMBULATORY_CARE_PROVIDER_SITE_OTHER): Payer: Medicare Other | Admitting: Cardiology

## 2015-08-25 ENCOUNTER — Encounter: Payer: Self-pay | Admitting: Cardiology

## 2015-08-25 VITALS — BP 106/58 | HR 60 | Ht 68.5 in | Wt 187.2 lb

## 2015-08-25 DIAGNOSIS — I481 Persistent atrial fibrillation: Secondary | ICD-10-CM

## 2015-08-25 DIAGNOSIS — I2510 Atherosclerotic heart disease of native coronary artery without angina pectoris: Secondary | ICD-10-CM

## 2015-08-25 DIAGNOSIS — J432 Centrilobular emphysema: Secondary | ICD-10-CM | POA: Diagnosis not present

## 2015-08-25 DIAGNOSIS — K625 Hemorrhage of anus and rectum: Secondary | ICD-10-CM

## 2015-08-25 DIAGNOSIS — I2781 Cor pulmonale (chronic): Secondary | ICD-10-CM

## 2015-08-25 DIAGNOSIS — K922 Gastrointestinal hemorrhage, unspecified: Secondary | ICD-10-CM | POA: Insufficient documentation

## 2015-08-25 DIAGNOSIS — I4819 Other persistent atrial fibrillation: Secondary | ICD-10-CM

## 2015-08-25 DIAGNOSIS — Z950 Presence of cardiac pacemaker: Secondary | ICD-10-CM

## 2015-08-25 NOTE — Patient Instructions (Addendum)
Continue your current therapy   Restrict your sodium intake  I will see you in 6 months.

## 2015-08-26 NOTE — Progress Notes (Signed)
[name] Date of Birth: Dec 30, 1930   History of Present Illness: [name] is seen for followup CAD and Afib. He has a history of CAD and is s/p CABG in 2001. He had a normal Myoview in June 2013.  He has a history of atrial fibrillation and tachy-brady syndrome. He is s/p pacemaker implant in 2009 and had generator revision in June 2016.  He is on Pradaxa. He was hospitalized in June 2016 with respiratory failure secondary to COPD exacerbation. He is now on home oxygen. Noted on Echo to have severe pulmonary HTN with right heart enlargement.  He is now living at [facility]. He reports that one week ago he got up to use the bathroom at night and had a bloody stool with bright red blood. 2 fairly large stools. He was seen by primary care and labs done. Apparently Hgb stable. On exam he was noted to have hemorrhoids. Pradaxa held for 3 days then resumed. No recurrent bleeding since. States he saw a Psychologist, sport and exercise on Monday- no additional studies done. He states he had colonoscopy here 2 years ago but I cannot find report.   Current Outpatient Prescriptions on File Prior to Visit  Medication Sig Dispense Refill  . acetaminophen (TYLENOL) 325 MG tablet Take 650 mg by mouth as needed for mild pain or moderate pain.    Marland Kitchen albuterol (PROAIR HFA) 108 (90 BASE) MCG/ACT inhaler Inhale 2 puffs into the lungs every 4 (four) hours as needed for wheezing or shortness of breath. 1 Inhaler 0  . atorvastatin (LIPITOR) 40 MG tablet TAKE 1 TABLET BY MOUTH EVERY DAY 90 tablet 0  . budesonide-formoterol (SYMBICORT) 160-4.5 MCG/ACT inhaler TAKE 2 PUFFS TWICE A DAY 10.2 Inhaler 5  . ferrous sulfate 325 (65 FE) MG tablet Take 325 mg by mouth daily.      . furosemide (LASIX) 20 MG tablet TAKE 1 TABLET (20 MG TOTAL) BY MOUTH DAILY. 90 tablet 3  . gabapentin (NEURONTIN) 300 MG capsule Take 1 capsule (300 mg total) by mouth 2 (two) times daily. 60 capsule prn  . ipratropium (ATROVENT) 0.03 % nasal spray USE 1 SPRAY IN  EACH NOSTRIL AT BEDTIME 30 mL 2  . metoprolol succinate (TOPROL-XL) 50 MG 24 hr tablet Take 25 mg by mouth daily.     . Multiple Vitamin (MULTI-VITAMIN PO) Take 1 tablet by mouth daily.     . OXYGEN Inhale 2 L into the lungs continuous.    Marland Kitchen PRADAXA 150 MG CAPS capsule TAKE 1 CAPSULE (150 MG TOTAL) BY MOUTH EVERY 12 (TWELVE) HOURS. 180 capsule 2  . tiotropium (SPIRIVA HANDIHALER) 18 MCG inhalation capsule PLACE 1 CAPSULE (18 MCG TOTAL) INTO INHALER AND INHALE DAILY. 30 capsule 5   No current facility-administered medications on file prior to visit.    No Known Allergies  Past Medical History  Diagnosis Date  . HTN (hypertension)   . Hypercholesterolemia   . Sleep apnea   . COPD (chronic obstructive pulmonary disease) ([place])   . Coronary artery disease     post CABG x4 in June, 2001  . Tachy-brady syndrome ([place])     a. s/p MDT dual chamber PPM  . Peripheral neuropathy ([place])   . Pulmonary HTN ([place])   . Right heart failure [facility])     Past Surgical History  Procedure Laterality Date  . Coronary artery bypass graft  10/04/1999    x4 -- LIMA graft to LAD, saphenous vein graft to the diagonal, saphenous vein  graft to the first OM vessel, and saphenous vein graft to the distal RCA  . Cardiac catheterization  10/03/1999    Est. EF of 65% -- Severe left main and two-vessel obstructive atherosclerotic coronary  -- Diffuse ectasia of the right coronary artery -- Normal left ventricular function  . Right knee surgery    . Ep implantable device N/A 10/08/2014    MDT dual chamber PPM gen change by Dr Caryl Comes    History  Smoking status  . Former Smoker -- 1.00 packs/day for 32 years  . Types: Cigarettes  . Quit date: 11/26/1982  Smokeless tobacco  . Former Systems developer  . Types: Chew  . Quit date: 11/26/2002    Comment: quit smoking 31 yrs ago    History  Alcohol Use No    Family History  Problem Relation Age of Onset  . Heart attack Father   . Heart attack Mother 28    and has had  previous angioplasty    Review of Systems: As noted in history of present illness.   All other systems were reviewed and are negative.  Physical Exam: BP 106/58 mmHg  Pulse 60  Ht 5' 8.5" (1.74 m)  Wt 84.913 kg (187 lb 3.2 oz)  BMI 28.05 kg/m2 He is a elderly white male who appears chronically ill. His HEENT exam is unremarkable. He has prominent  JV pulsations without bruits. Lungs are clear. Cardiac exam reveals a regular rate and rhythm with a grade [id number] systolic murmur in the apex. Abdomen is soft and nontender without masses or bruits. Extremities are with trace edema.. Chronic venous stasis changes. Pedal pulses are good. His neurologic exam is nonfocal.  LABORATORY DATA: Lab Results  Component Value Date   WBC 7.6 10/01/2014   HGB 16.1 10/01/2014   HCT 48.4 10/01/2014   PLT 166.0 10/01/2014   GLUCOSE 120* 10/01/2014   CHOL 127 04/23/2012   TRIG 98.0 04/23/2012   HDL 43.70 04/23/2012   LDLDIRECT 200.1 01/13/2012   LDLCALC 64 04/23/2012   ALT 32 09/25/2014   AST 43* 09/25/2014   NA 139 10/01/2014   K 4.1 10/01/2014   CL 99 10/01/2014   CREATININE 1.00 10/01/2014   BUN 30* 10/01/2014   CO2 34* 10/01/2014   TSH 4.67 04/15/2013     Echo: 09/26/14:Study Conclusions  - Left ventricle: The cavity size was normal. Wall thickness was increased in a pattern of mild LVH. The estimated ejection fraction was 60%. - Aortic valve: Sclerosis without stenosis. There was no significant regurgitation. - Left atrium: The atrium was moderately to severely dilated. - Right ventricle: There is dilitation of the RV. Pacer is in place. Systolic function was moderately reduced. - Pulmonary arteries: PA peak pressure: 75 mm Hg (S).  Assessment / Plan: 1. Coronary disease status post CABG in 2001. Patient had a  Myoview study in June 2013 which showed no ischemia and normal ejection fraction. He has no symptoms of chest pain. We will continue with his current medical  therapy.  2. Atrial fibrillation. He is on chronic anticoagulation. His rate control appears acceptable. He is status post pacemaker implant for bradycardia. Pacer check in February was satisfactory.  3. Hyperlipidemia. On statin.  4. COPD- progressive now on home oxygen.  5. Hypertension, controlled.  6. Right heart failure with cor pulmonale and severe pulmonary HTN secondary to #4. Continue current lasix dose. Stressed importance of sodium restriction. His weight is up 12 lbs from prior but he really doesn't appear volume  overloaded and I suspect this is more related to increased calorie intake.   7. Recent GI bleed with bright red blood per rectum. Differential includes hemorrhoidal bleeding, diverticular bleed, AVM. Doubt CA with colonoscopy 2 years ago. He has resumed Pradaxa. If he has recurrent bleeding will need to hold anticoagulation until bleeding evaluated further   I will follow up in 6 months.

## 2015-09-03 ENCOUNTER — Encounter: Payer: Medicare Other | Admitting: *Deleted

## 2015-09-03 ENCOUNTER — Telehealth: Payer: Self-pay | Admitting: Cardiology

## 2015-09-03 NOTE — Telephone Encounter (Signed)
Spoke with pt and reminded pt of remote transmission that is due today. Pt verbalized understanding.   

## 2015-09-04 ENCOUNTER — Encounter: Payer: Self-pay | Admitting: Cardiology

## 2015-09-11 ENCOUNTER — Ambulatory Visit (INDEPENDENT_AMBULATORY_CARE_PROVIDER_SITE_OTHER): Payer: Medicare Other | Admitting: *Deleted

## 2015-09-11 ENCOUNTER — Telehealth: Payer: Self-pay | Admitting: Internal Medicine

## 2015-09-11 DIAGNOSIS — I495 Sick sinus syndrome: Secondary | ICD-10-CM | POA: Diagnosis not present

## 2015-09-11 NOTE — Telephone Encounter (Signed)
Spoke w/ pt and attempted to help w/ his remote transmission. Informed pt that we received his remote transmission. Pt verbalized understanding.

## 2015-09-11 NOTE — Telephone Encounter (Signed)
Pt needs a call back about his remote pacer ck device is not working and needs some help with it.  Pt is not sure his last transmission was received.

## 2015-09-16 NOTE — Progress Notes (Signed)
Remote pacemaker transmission.   

## 2015-09-30 ENCOUNTER — Encounter: Payer: Self-pay | Admitting: Cardiology

## 2015-10-02 LAB — CUP PACEART REMOTE DEVICE CHECK
Battery Remaining Longevity: 147 mo
Brady Statistic RV Percent Paced: 96 %
Implantable Lead Implant Date: 20091218
Implantable Lead Location: 753860
Implantable Lead Model: 5076
Lead Channel Impedance Value: 615 Ohm
Lead Channel Setting Pacing Pulse Width: 0.4 ms
Lead Channel Setting Sensing Sensitivity: 5.6 mV
MDC IDC LEAD IMPLANT DT: 20091218
MDC IDC LEAD LOCATION: 753859
MDC IDC MSMT BATTERY IMPEDANCE: 112 Ohm
MDC IDC MSMT BATTERY VOLTAGE: 2.79 V
MDC IDC MSMT LEADCHNL RA IMPEDANCE VALUE: 67 Ohm
MDC IDC MSMT LEADCHNL RV PACING THRESHOLD AMPLITUDE: 0.5 V
MDC IDC MSMT LEADCHNL RV PACING THRESHOLD PULSEWIDTH: 0.4 ms
MDC IDC SESS DTM: 20170519190526
MDC IDC SET LEADCHNL RV PACING AMPLITUDE: 2.5 V

## 2015-10-07 ENCOUNTER — Other Ambulatory Visit: Payer: Self-pay

## 2015-10-07 DIAGNOSIS — J431 Panlobular emphysema: Secondary | ICD-10-CM | POA: Diagnosis not present

## 2015-10-07 DIAGNOSIS — I10 Essential (primary) hypertension: Secondary | ICD-10-CM | POA: Diagnosis not present

## 2015-10-07 DIAGNOSIS — I482 Chronic atrial fibrillation: Secondary | ICD-10-CM | POA: Diagnosis not present

## 2015-10-07 DIAGNOSIS — G2581 Restless legs syndrome: Secondary | ICD-10-CM | POA: Diagnosis not present

## 2015-10-07 DIAGNOSIS — E7800 Pure hypercholesterolemia, unspecified: Secondary | ICD-10-CM | POA: Diagnosis not present

## 2015-10-07 DIAGNOSIS — M21372 Foot drop, left foot: Secondary | ICD-10-CM | POA: Diagnosis not present

## 2015-10-07 MED ORDER — GABAPENTIN 300 MG PO CAPS: 300.0000 mg | ORAL_CAPSULE | Freq: Two times a day (BID) | ORAL | Status: DC

## 2015-10-08 DIAGNOSIS — E7800 Pure hypercholesterolemia, unspecified: Secondary | ICD-10-CM | POA: Diagnosis not present

## 2015-10-08 DIAGNOSIS — I10 Essential (primary) hypertension: Secondary | ICD-10-CM | POA: Diagnosis not present

## 2015-10-16 ENCOUNTER — Encounter: Payer: Self-pay | Admitting: Cardiology

## 2015-11-06 ENCOUNTER — Other Ambulatory Visit: Payer: Self-pay | Admitting: Pulmonary Disease

## 2015-11-16 ENCOUNTER — Other Ambulatory Visit: Payer: Self-pay

## 2015-11-16 MED ORDER — ATORVASTATIN CALCIUM 40 MG PO TABS: 40.0000 mg | ORAL_TABLET | Freq: Every day | ORAL | 0 refills | Status: DC

## 2015-11-19 ENCOUNTER — Telehealth: Payer: Self-pay | Admitting: Pulmonary Disease

## 2015-11-19 ENCOUNTER — Other Ambulatory Visit (HOSPITAL_COMMUNITY): Payer: Medicare Other

## 2015-11-19 ENCOUNTER — Ambulatory Visit (INDEPENDENT_AMBULATORY_CARE_PROVIDER_SITE_OTHER): Payer: Medicare Other | Admitting: Pulmonary Disease

## 2015-11-19 VITALS — BP 110/62 | HR 65 | Temp 97.4°F | Ht 69.0 in | Wt 177.0 lb

## 2015-11-19 DIAGNOSIS — J432 Centrilobular emphysema: Secondary | ICD-10-CM | POA: Diagnosis not present

## 2015-11-19 DIAGNOSIS — J9611 Chronic respiratory failure with hypoxia: Secondary | ICD-10-CM

## 2015-11-19 DIAGNOSIS — I2781 Cor pulmonale (chronic): Secondary | ICD-10-CM | POA: Diagnosis not present

## 2015-11-19 DIAGNOSIS — Z951 Presence of aortocoronary bypass graft: Secondary | ICD-10-CM | POA: Diagnosis not present

## 2015-11-19 DIAGNOSIS — I2510 Atherosclerotic heart disease of native coronary artery without angina pectoris: Secondary | ICD-10-CM | POA: Diagnosis not present

## 2015-11-19 DIAGNOSIS — I481 Persistent atrial fibrillation: Secondary | ICD-10-CM

## 2015-11-19 DIAGNOSIS — G4733 Obstructive sleep apnea (adult) (pediatric): Secondary | ICD-10-CM

## 2015-11-19 DIAGNOSIS — I4819 Other persistent atrial fibrillation: Secondary | ICD-10-CM

## 2015-11-19 DIAGNOSIS — Z950 Presence of cardiac pacemaker: Secondary | ICD-10-CM

## 2015-11-19 MED ORDER — IPRATROPIUM-ALBUTEROL 0.5-2.5 (3) MG/3ML IN SOLN: RESPIRATORY_TRACT | 11 refills | Status: DC

## 2015-11-19 MED ORDER — ALBUTEROL SULFATE (2.5 MG/3ML) 0.083% IN NEBU
2.5000 mg | INHALATION_SOLUTION | Freq: Once | RESPIRATORY_TRACT | Status: AC
  Administered 2015-11-19: 2.5 mg via RESPIRATORY_TRACT

## 2015-11-19 NOTE — Telephone Encounter (Signed)
Called [facility] at the # above. Was advised they need today's notes faxed over to them signed. Notes is not done from today. Also the order faxed over according to [facility] did not have SN's NPI on the form.  Also they are needing a separate order placed for patient neb machine. I have done this. Please advise Leigh once note is done so we can fax this to [facility] thanks

## 2015-11-19 NOTE — Patient Instructions (Signed)
Today we updated your med list in our EPIC system...    Continue your current medications the same...  We decided to get you a NEBULIZER machine to take treatments w/ DUONEB up to 3 times daily as we discussed...  Continue your SYMBICORT, SPIRIVA, & OXGEN Therapy the same...  Continue using your CPAP each night & we will request new MASK & TUBING thru your DME company...  Call for any questions...  Remember to get the 2017 flu vaccine when they are avail thru your local Pharmacy in the fall...  Let's plan a follow up visit in 20mo, sooner if needed for any problems.Marland KitchenMarland Kitchen

## 2015-11-21 ENCOUNTER — Encounter: Payer: Self-pay | Admitting: Pulmonary Disease

## 2015-11-21 NOTE — Progress Notes (Signed)
Subjective:     Patient ID: Roy Banks, male   DOB: Dec 10, 1930, 82 y.o.   MRN: [id number]  HPI ~  October 10, 2014:  Initial pulmonary consult by SN>  76 y/o WM, referred by [facility] for on-going pulmonary evaluation & treatment> his PCP is Dr. Claris Gower; medical hx includes DM w/ neuropathy...      He was prev seen by DrWert, last 12/14> former smoker, quit 1984, followed for COPD w/ CTChest 2014 showing centrilobular emphysema & prev PFTs w/ mod airflow obstruction;  He's had cough, beige sput, wheezing, DOE and treated w/ Symbicort160-2spBid, Spiriva daily, ProventilHFA as needed... He was referred to pulmonary rehab but he never participated... He receives occas Pred tapers during acute exac;        Followed by Kallie Edward for OSA, last 6/15> on CPAP x70yrs, good compliance, uses Klonopin for RLS;        ADM by [facility] 6/2 - 09/27/14 w/ hypoxemia & COPD exac, incr SOB but denied cough/ sput/ hemoptysis; found to have right heart failure/ PulmHTN;  Treated w/ Pred, Doxy, Oxygen started this adm, & continuation of Symbicort160, Spiriva, Proair;  He was diuresed w/ Lasix & asked to f/u w/ Cards to consider right heart cath...       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 Pacer generator changed 10/08/14 by DrKlein...  EXAM showed Afeb, VSS, O2sat=91% on O2 at 2L/min;  HEENT- neg, mallampati2;  Chest- decr BS but w/o w/r/r;  Heart- pacer, RR, gr1/6SEM w/o r/g;  Abd- soft, neg;  Ext- trace edema w/o c/c...  Sleep [facility] 12/[facility] at [facility] reported mild OSA w/ RDI=9/hr, loud snoring, PLMS=5.5/hr, & lowest O2sat=91% on RA... Started on CPAP- details unknown, & Klonopin for  RLS...  CTChest 08/28/12 showed borderline heart size, no signif adenopathy, modHH, atherosclerosis in Ao, coronaries (including [place]), & s/pCABG; mod to severe centrilobular emphysema, mild biapical pleuroparenchymal thickening, no other lung lesions; large right renal cyst...   CXR 09/25/14 showed s/pCABG w/ pacer in place, trace bilat effus, hyperinflated w/ incr markings, NAD...   2DEcho 6/16 showed mildLVH, norm LVF w/ EF=60%, AVsclerosis w/o stenosis, mod to severe LAdil, RVdil w/ mod reduction in RV function & est PAsys~54mmHg  LABS 6/16 showed elevBS, BNP=1150  Ambulatory oxygen saturation test> On 2L/min his O2sat=98% w/ pulse=61/min;  Walked 1 lap w/ O2sat=91% w/ pulse=76/min... IMP/PLAN>>   Roy Banks has COPD/emphysema on Symbicort, Spiriva, ProairHFA (he finished prev Doxy, Pred);  OSA on CPAP & we will contact [employer] for machine download data;  CorPulmonale w/ right heart failure and pulm art HTN=> on O2 at 2L/min 24/7 now & continue Lasix20...  ADDENDUM>> CPAP download date from [employer]> 5/6 - 09/27/14=> CPAP=7, 28/30 days, 7-8h per night, AHI=2.0; good compliance & effectiveness confirmed...    ~  November 12, 2014:  2mo OV w/ SN>   Roy Banks has moved to [place] he tells me that he still prefers to get his medical care here in [place];  His breathing is OK on his O2 at 2L/min 24/7 via [facility] Romans- it is bled into his CPAP at night & using bottles during the day- he needs pulse dose set up & may  be better served w/ POC so we will inquire on his behalf...     COPD/emphysema> on Symbicort160-2spBid, Spiriva daily, ProairHFA prn (off prev Doxy, Pred); CT 2014 w/ mod to severe centrilob emphysema    OSA on CPAP> download from [place] 5/16 showed CPAP=7 (he says 5), good compliance & effectiveness confirmed w/ AHI=2...    Right heart failure and pulmonary HTN> 2DEcho 6/16 w/ RVdil & mod reduced RV function & PAsys est~13mmHg; started on O2 and diuresed w/ Lasix20; DrKlein incr to Lasix20Bid transiently...     CAD- s/p CABG> followed by DrJordan for Cards on MetopER50-1/2, Lasix20/d; last OV 10/2013 & due for yearly check up soon...    AFib,Hx tachy-brady w/ pacer> on Pradaxa & followed by DrKlein; generator changed 10/08/14 & doing satis, he does tele-pacer checks monthly...    DM w/ neuropathy> on diet alone for his DM (BS in hosp 120-160, no A1c in Epic); on Neurontin300Qhs...     Hiatus hernia> aware, not currently on meds, denies reflux, heartburn, dysphagia, n/v, d/c, blood seen...       Renal cyst EXAM shows Afeb, VSS, O2sat=92% on 2l/min at rest;  HEENT- neg, mallampati2;  Chest- decr BS but w/o w/r/r;  Heart- pacer, RR, gr1/6SEM w/o r/g;  Abd- soft, neg;  Ext- trace edema w/o c/c... IMP/PLAN>>  No labs done today, he has upcoming appt w/ DrJordan, Lasix20 refilled & reminded NO SALT, we will arrange for ONO on CPAP/ on O2 to confirm no hypoxemia at night, needs pulse dose set up & he would benefit from POC;  Plan ROV 51mo, sooner prn... ADDENDUM>>  ONO done 12/20/14 on CPAP & on O2 at 2L/min showed resolution of the nocturnal hypoxemia- mean O2sat=94.7%, time <88% was 58min36sec (0.4% of the recording) and he was >90% for 99.5%; continue same.   ~  January 13, 2015:  15mo ROV w/ SN>   Roy Banks returns and reports a stable 66mo interval- he remains on O2at 2L/min, Symbicort160-2spBid, Spriva daily, AlbutHFA as needed; he notes that he is not resting as well recently & blames a noise his CPAP machine makes now that the oxygen is bled-in; he also has c/o mask fit => we will ask [employer] to check his machine & mask...  Roy Banks also notes right leg pain (esp at night) & he was taking Gabapentin 300mg Qhs but wondered if this was the cause so he stopped it; we discussed this & noted how this is a low dose for neuropathy problems asking him to try increasing the medication to 2 tabsQhs; wife notes more of a prob from RLS by her description...     He remains on ToprolXL25, Lasix20, Pradaxa150Bid; CAD, s/p CABG, AFib, hx  tachy-brady w/ pacer, right heart failure & PulmHTN...    He had f/u DrJordan for Cards 8/16> note reviewed, they stressed sodium restriction and continued the same meds...    He also had a f/u appt w/ DrKlein 8/16> note reviewed, normal pacer function, rec to continue same meds & teletrace...  EXAM shows Afeb, VSS, O2sat=95% on 2l/min at rest;  HEENT- neg, mallampati1;  Chest- decr BS but w/o w/r/r;  Heart- pacer, irreg, gr1/6SEM w/o r/g;  Abd- soft, neg;  Ext- trace edema w/o c/c... IMP/PLAN>>  We discussed his medications and the only rec change today is to try incr the Gabapentin from 300mg Qhs to 600gmQhs & see if we can diminish his nocturnal leg discomfort; he will also restart an exercise program rec by a former chiro to help  his back & legs...   ~  May 18, 2015:  30mo ROV w/SN>   Roy Banks continues to come from [facility] to [place] for his pulmonary follow-up- he has a new PCP Dr. Magdalene Molly in [place], [place] (copy of this summary note sent to him)...  Pt reports that he uses CPAP 7-10 Hrs per night & breathing is stable, he says [employer] sent him a note indicating that he needs to have 2 tests done but he left the letter at home 7 dosen't known what they are- we did not receive a similar note from them; he is asked to CALL us once he gets home & retreived the letter...     Roy Banks had one interval visit- CARDS f/u DrJordan 03/02/15>  Followed for CAD- CABG2001, AFib & tachy-brady w/ pacemaker 2009 & generator revision 6/16;  MYOVIEW 09/2011 was NEG;  MEDS+ Pradaxa150Bid, MetoprololER25, Lasix20;  He has PulmHTN on 2DEcho (last 6/16 PAsys=75)- on Home O2; no change in meds...    COPD/emphysema> on Symbicort160-2spBid, Spiriva daily, ProairHFA prn (not often); CT 2014 w/ mod to severe centrilob emphysema; breathing stable on meds...    OSA on CPAP> download from [place] 5/16 showed CPAP=7 (he says 5), good compliance & effectiveness confirmed w/ AHI=2; he needs to keep up w/ mask changes and machine  checks thru [facility]...    Right heart failure and pulmonary HTN> 2DEcho 6/16 w/ RVdil & mod reduced RV function & PAsys est~57mmHg; started on O2 and diuresed w/ Lasix20; DrKlein incr to Lasix20Bid transiently.     CAD- s/p CABG> followed by DrJordan for Cards on MetopER50-1/2, Lasix20/d; last OV 02/2015 as above 7 no changes made.    AFib,Hx tachy-brady w/ pacer> on Pradaxa & followed by DrKlein; generator changed 10/08/14 & doing satis, he does tele-pacer checks monthly...    DM w/ neuropathy> on diet alone for his DM (BS in hosp 120-160, no A1c in Epic); on Neurontin300Bid now & improved...     Hiatus hernia> aware, not currently on meds, denies reflux, heartburn, dysphagia, n/v, d/c, blood seen...       Renal cyst> aware, Cr is wnl at 1.0-1.1.Marland KitchenMarland Kitchen EXAM shows Afeb, VSS, O2sat=90% on 2l/min at rest;  HEENT- neg, mallampati1;  Chest- decr BS but w/o w/r/r;  Heart- pacer, irreg, gr1/6SEM w/o r/g;  Abd- soft, neg;  Ext- trace edema w/o c/c... IMP/PLAN>>  Roy Banks will work w/ his DME at [employer] regarding mask fit & machine function... He needs to incr exercise program as if in [place] program... Same meds and we will plan f/u 2DEcho prior to his ROV...  ADDENDUM>>  2DEcho done 08/18/15>> norm LV size, LVF, & wall thickness w/ EF=55-60% (no regional wall motion abn); Gr2DD, AoV ok, MV- mildMR, mod LA&RA dil, mod RV dil w/ decr RV function, PAsys=11mmHg (improved from 23mmHg 09/2014).   ~  November 19, 2015:  48mo ROV w/ SN>  Roy Banks's new PCP is Dr. Elsie Amis in [place]... Roy Banks has severe COPD/emphysema, OSA, CAD-s/pCABG, Afib/ tachy-brady w/ pacer inserted, etc;  He had a good 34mo interval w/ only one episode of increased SOB occurring 2-3wks ago assoc w/ min cough, some beige sput, no hemoptysis/ CP/ f/c/s/ etc;  He remains on O2 at 2L/min flow & has been taking Symbicort160-2spBid & Spiriva daily; he used his ProairHFA rescue inhaler during the episode of incr SOB & found that this worked well for him- now  resolved; we discussed trial of a home NEBULIZER w/ Duoneb to use up  to 3 times daily as needed...     We have had some difficulty getting his DME company to keep up w/ CPAP mask & tubing supplies, but says it's due to [employer] not paying the bill- we will ask our [facility] to look into this for him...    Otherw pt is stable from the pulmonary standpoint & we reviewed the need to stay on meds regularly & engage in a regular exercise program... EXAM shows Afeb, VSS, O2sat=95% on 2l/min at rest;  HEENT- neg, mallampati1;  Chest- decr BS but w/o w/r/r;  Heart- pacer, irreg, gr1/6SEM w/o r/g;  Abd- soft, neg;  Ext- trace edema w/o c/c  He indicates that he had recent labs from his [facility] PCP [facility]...  IMP/PLANS>>  We have requested a home nebulizer w/ Duoneb to use Tid prn at home; he will continue his other meds the same, and increase his exercise program...     Past Medical History  Diagnosis Date  . HTN (hypertension) >> on MetoprololER50-1/2Qd, Lasix20   . Hypercholesterolemia >> on Lipitor40   . Sleep apnea >> on CPAP followed by DrAlva   . COPD (chronic obstructive pulmonary disease) w/ CorPulmonale- O2 started 09/2014 on 2L/min    . Coronary artery disease >> followed by DrJordan     post CABG x4 in June, 2001  . Tachy-brady syndrome   . Atrial fibrillation >> on Pradaxa 150Bid     post DDD pacemaker implant >> followed by DrKlein  . Peripheral neuropathy >> on Neurontin 300Bid     Past Surgical History:  Procedure Laterality Date  . CARDIAC CATHETERIZATION  10/03/1999   Est. EF of 65% -- Severe left main and two-vessel obstructive atherosclerotic coronary  -- Diffuse ectasia of the right coronary artery -- Normal left ventricular function  . CORONARY ARTERY BYPASS GRAFT  10/04/1999   x4 -- LIMA graft to LAD, saphenous vein graft to the diagonal, saphenous vein graft to the first OM vessel, and saphenous vein graft to the distal RCA  . EP IMPLANTABLE DEVICE N/A 10/08/2014   MDT dual chamber  PPM gen change by Dr Caryl Comes  . right knee surgery      Outpatient Encounter Prescriptions as of 11/19/2015  Medication Sig  . acetaminophen (TYLENOL) 325 MG tablet Take 650 mg by mouth as needed for mild pain or moderate pain.  Marland Kitchen albuterol (PROAIR HFA) 108 (90 BASE) MCG/ACT inhaler Inhale 2 puffs into the lungs every 4 (four) hours as needed for wheezing or shortness of breath.  Marland Kitchen atorvastatin (LIPITOR) 40 MG tablet Take 1 tablet (40 mg total) by mouth daily.  . budesonide-formoterol (SYMBICORT) 160-4.5 MCG/ACT inhaler TAKE 2 PUFFS TWICE A DAY  . ferrous sulfate 325 (65 FE) MG tablet Take 325 mg by mouth daily.    . furosemide (LASIX) 20 MG tablet TAKE 1 TABLET (20 MG TOTAL) BY MOUTH DAILY.  Marland Kitchen gabapentin (NEURONTIN) 300 MG capsule Take 1 capsule (300 mg total) by mouth 2 (two) times daily.  Marland Kitchen ipratropium (ATROVENT) 0.03 % nasal spray USE 1 SPRAY IN EACH NOSTRIL AT BEDTIME  . metoprolol succinate (TOPROL-XL) 50 MG 24 hr tablet Take 25 mg by mouth daily.   . Multiple Vitamin (MULTI-VITAMIN PO) Take 1 tablet by mouth daily.   . OXYGEN Inhale 2 L into the lungs continuous.  Marland Kitchen PRADAXA 150 MG CAPS capsule TAKE 1 CAPSULE (150 MG TOTAL) BY MOUTH EVERY 12 (TWELVE) HOURS.  Marland Kitchen tiotropium (SPIRIVA HANDIHALER) 18 MCG inhalation capsule PLACE  1 CAPSULE (18 MCG TOTAL) INTO INHALER AND INHALE DAILY.    No Known Allergies   Immunization History  Administered Date(s) Administered  . Influenza Split 01/23/2013, 01/21/2015  . Influenza,inj,Quad PF,36+ Mos 04/03/2014    Current Medications, Allergies, Past Medical History, Past Surgical History, Family History, and Social History were reviewed in [employer] record.   Review of Systems            All symptoms NEG except where BOLDED >>  Constitutional:  F/C/S, fatigue, anorexia, unexpected weight change. HEENT:  HA, visual changes, hearing loss, earache, nasal symptoms, sore throat, mouth sores, hoarseness. Resp:  cough, sputum,  hemoptysis; SOB, tightness, wheezing. Cardio:  CP, palpit, DOE, orthopnea, edema. GI:  N/V/D/C, blood in stool; reflux, abd pain, distention, gas. GU:  dysuria, freq, urgency, hematuria, flank pain, voiding difficulty. MS:  joint pain, swelling, tenderness, decr ROM; neck pain, back pain, etc. Neuro:  HA, tremors, seizures, dizziness, syncope, weakness, numbness, gait abn. Skin:  suspicious lesions or skin rash. Heme:  adenopathy, bruising, bleeding. Psyche:  confusion, agitation, sleep disturbance, hallucinations, anxiety, depression suicidal.   Objective:   Physical Exam      Vital Signs:  Reviewed...   General:  WD, WN, 82 y/o WM in NAD; alert & oriented; pleasant & cooperative... HEENT:  [place]/AT; Conjunctiva- pink, Sclera- nonicteric, EOM-wnl, PERRLA, EACs-clear, TMs-wnl; NOSE-clear; THROAT-clear & wnl.  Neck:  Supple w/ fair ROM; no JVD; normal carotid impulses w/o bruits; no thyromegaly or nodules palpated; no lymphadenopathy.  Chest:  decr BS bilat; without wheezes, rales, or rhonchi heard. Heart:  irreg; pacer, without murmurs, rubs, or gallops detected. Abdomen:  Soft & nontender- no guarding or rebound; normal bowel sounds; no organomegaly or masses palpated. Ext:  Normal ROM; without deformities or arthritic changes; no varicose veins, venous insuffic, or edema;  Pulses intact w/o bruits. Neuro:  No focal neuro deficits; gait normal & balance OK. Derm:  No lesions noted; no rash etc. Lymph:  No cervical, supraclavicular, axillary, or inguinal adenopathy palpated.   Assessment:      IMP >>    COPD/emphysema ==> continue inhalers (Symbicort160, Spiriva), O2 at 2L/min, exercise, & we added NEB w/ Duoneb prn...    OSA on CPAP ==> continue CPAP & O2 at 2L/min 24/7 & ONO check was ok...    Right heart failure and pulmonary HTN ==> continue Lasix20 & O2; f/u 2DEcho 07/2015 showed a decr in PAsys to 12mmHg (down from 75).    CAD- s/p CABG => per DrJordan    AFib, Hx tachy-brady w/  pacer => per DrKlein w/ generator change 09/2014.    DM w/ neuropathy => on Gabapentin300Bid.    Hiatus hernia    Renal cyst  PLAN >>  10/10/14>   Roy Banks has COPD/emphysema on Symbicort, Spiriva, ProairHFA (he finished prev Doxy, Pred);  OSA on CPAP & we will contact [employer] for machine download data;  CorPulmonale w/ right heart failure and pulm art HTN=> on O2 at 2L/min 24/7 now & continue Lasix20...  01/13/15>   We discussed his medications and the only rec change today is to try incr the Gabapentin from 300mg Qhs to 600gmQhs & see if we can diminish his nocturnal leg discomfort; he will also restart an exercise program rec by a former chiro to help his back & legs... 05/18/15>   Roy Banks will work w/ his DME at [employer] regarding mask fit & machine function... He needs to incr exercise program as if in [facility]  program... Same meds and we will plan f/u 2DEcho prior to his ROV...  11/19/15>   We have requested a home nebulizer w/ Duoneb to use Tid prn at home; he will continue his other meds the same, and increase his exercise program...      Plan:     Patient's Medications  New Prescriptions   IPRATROPIUM-ALBUTEROL (DUONEB) 0.5-2.5 (3) MG/3ML SOLN    Use 1 vial in nebulizer up to 3 times daily and as needed.  DX:  J43.2, J96.11  Previous Medications   ACETAMINOPHEN (TYLENOL) 325 MG TABLET    Take 650 mg by mouth as needed for mild pain or moderate pain.   ALBUTEROL (PROAIR HFA) 108 (90 BASE) MCG/ACT INHALER    Inhale 2 puffs into the lungs every 4 (four) hours as needed for wheezing or shortness of breath.   ATORVASTATIN (LIPITOR) 40 MG TABLET    Take 1 tablet (40 mg total) by mouth daily.   BUDESONIDE-FORMOTEROL (SYMBICORT) 160-4.5 MCG/ACT INHALER    TAKE 2 PUFFS TWICE A DAY   FERROUS SULFATE 325 (65 FE) MG TABLET    Take 325 mg by mouth daily.     FUROSEMIDE (LASIX) 20 MG TABLET    TAKE 1 TABLET (20 MG TOTAL) BY MOUTH DAILY.   GABAPENTIN (NEURONTIN) 300 MG CAPSULE    Take 1 capsule (300 mg  total) by mouth 2 (two) times daily.   IPRATROPIUM (ATROVENT) 0.03 % NASAL SPRAY    USE 1 SPRAY IN EACH NOSTRIL AT BEDTIME   METOPROLOL SUCCINATE (TOPROL-XL) 50 MG 24 HR TABLET    Take 25 mg by mouth daily.    MULTIPLE VITAMIN (MULTI-VITAMIN PO)    Take 1 tablet by mouth daily.    OXYGEN    Inhale 2 L into the lungs continuous.   PRADAXA 150 MG CAPS CAPSULE    TAKE 1 CAPSULE (150 MG TOTAL) BY MOUTH EVERY 12 (TWELVE) HOURS.   TIOTROPIUM (SPIRIVA HANDIHALER) 18 MCG INHALATION CAPSULE    PLACE 1 CAPSULE (18 MCG TOTAL) INTO INHALER AND INHALE DAILY.  Modified Medications   No medications on file  Discontinued Medications   No medications on file

## 2015-11-24 NOTE — Telephone Encounter (Signed)
This was done this morning.  Nothing further is needed.

## 2015-11-24 NOTE — Telephone Encounter (Signed)
Spoke with [place]  I advised note is signed and I will faxed to the number provided  She states that they are still needing albuterol billable document that she faxed signed and sent back  I will forward to [place] to see if this has been signed

## 2015-11-24 NOTE — Telephone Encounter (Signed)
[facility] calling back to check on the status of this order again and can be reached @ [phone]

## 2015-12-10 ENCOUNTER — Telehealth: Payer: Self-pay | Admitting: Pulmonary Disease

## 2015-12-10 MED ORDER — IPRATROPIUM-ALBUTEROL 0.5-2.5 (3) MG/3ML IN SOLN: RESPIRATORY_TRACT | 11 refills | Status: DC

## 2015-12-10 NOTE — Telephone Encounter (Signed)
Called and spoke with pt and he is aware of rx that has been sent to the pharmacy in [place].  Nothing further is needed.

## 2015-12-24 ENCOUNTER — Other Ambulatory Visit: Payer: Self-pay | Admitting: Pulmonary Disease

## 2016-01-01 ENCOUNTER — Encounter: Payer: Self-pay | Admitting: Internal Medicine

## 2016-01-15 ENCOUNTER — Ambulatory Visit (INDEPENDENT_AMBULATORY_CARE_PROVIDER_SITE_OTHER): Payer: Medicare Other | Admitting: Internal Medicine

## 2016-01-15 ENCOUNTER — Encounter: Payer: Self-pay | Admitting: Internal Medicine

## 2016-01-15 VITALS — BP 128/76 | HR 82 | Ht 68.0 in | Wt 176.6 lb

## 2016-01-15 DIAGNOSIS — I4821 Permanent atrial fibrillation: Secondary | ICD-10-CM

## 2016-01-15 DIAGNOSIS — Z950 Presence of cardiac pacemaker: Secondary | ICD-10-CM

## 2016-01-15 DIAGNOSIS — I5030 Unspecified diastolic (congestive) heart failure: Secondary | ICD-10-CM | POA: Diagnosis not present

## 2016-01-15 DIAGNOSIS — I2510 Atherosclerotic heart disease of native coronary artery without angina pectoris: Secondary | ICD-10-CM | POA: Diagnosis not present

## 2016-01-15 DIAGNOSIS — I482 Chronic atrial fibrillation: Secondary | ICD-10-CM | POA: Diagnosis not present

## 2016-01-15 DIAGNOSIS — I495 Sick sinus syndrome: Secondary | ICD-10-CM

## 2016-01-15 DIAGNOSIS — I442 Atrioventricular block, complete: Secondary | ICD-10-CM | POA: Diagnosis not present

## 2016-01-15 LAB — CUP PACEART INCLINIC DEVICE CHECK
Battery Impedance: 136 Ohm
Brady Statistic RV Percent Paced: 96 %
Date Time Interrogation Session: 20170922155319
Implantable Lead Implant Date: 20091218
Implantable Lead Model: 5076
Lead Channel Impedance Value: 67 Ohm
Lead Channel Setting Pacing Amplitude: 2 V
Lead Channel Setting Pacing Pulse Width: 0.4 ms
Lead Channel Setting Sensing Sensitivity: 5.6 mV
MDC IDC LEAD IMPLANT DT: 20091218
MDC IDC LEAD LOCATION: 753859
MDC IDC LEAD LOCATION: 753860
MDC IDC MSMT BATTERY REMAINING LONGEVITY: 140 mo
MDC IDC MSMT BATTERY VOLTAGE: 2.79 V
MDC IDC MSMT LEADCHNL RV IMPEDANCE VALUE: 612 Ohm
MDC IDC MSMT LEADCHNL RV PACING THRESHOLD AMPLITUDE: 0.5 V
MDC IDC MSMT LEADCHNL RV PACING THRESHOLD PULSEWIDTH: 0.4 ms

## 2016-01-15 NOTE — Patient Instructions (Signed)
Medication Instructions: - Your physician has recommended you make the following change in your medication:  1) Increase lasix (furosemide) to 20 mg one tablet by mouth twice daily x 3 days, then resume 20 mg once daily  Labwork: - Your physician recommends that you have lab work today: BMP/ CBC  Procedures/Testing: - none ordered  Follow-Up: - Remote monitoring is used to monitor your Pacemaker of ICD from home. This monitoring reduces the number of office visits required to check your device to one time per year. It allows [place] to keep an eye on the functioning of your device to ensure it is working properly. You are scheduled for a device check from home on 04/19/16. You may send your transmission at any time that day. If you have a wireless device, the transmission will be sent automatically. After your physician reviews your transmission, you will receive a postcard with your next transmission date.  - Your physician wants you to follow-up in: 1 year with Dr. Caryl Comes. You will receive a reminder letter in the mail two months in advance. If you don't receive a letter, please call our office to schedule the follow-up appointment.  Any Additional Special Instructions Will Be Listed Below (If Applicable).     If you need a refill on your cardiac medications before your next appointment, please call your pharmacy.

## 2016-01-15 NOTE — Progress Notes (Signed)
Patient Care Team: Pcp Not In System as PCP - General   HPI  Roy Banks is a 82 y.o. male Seen in followup for pacer implanted 2009 because of tachybradycardia syndrome. He underwent device generator replacement 6/16.   He has now persistent-long-term atrial fibrillation and has been anticoagulated with Pradaxa   He has a history of ischemic heart disease with prior bypass surgery   Myoview 2013.undertaken for dyspnea demonstrated no ischemia and ejection fraction 65% dyspnea has been attributed in part to COPD chest x-ray 2009 raised the possibility of interstitial fibrosis.  Echocardiogram 6/16 was reviewed and demonstrated normal LV function significant left atrial enlargement pulmonary hypertension and right ventricular dilatation Echo 4/17 normal LV function with moderate LAE (56/2.8/43)  He is now on chronic oxygen therapy. He continues with peripheral edema. It is not so clear to him or his wife that his shortness of breath is worse with his edema.    He is tolerating his Pradaxa without GI symptoms.  6/16 Cr 1.0 K4.1   Past Medical History:  Diagnosis Date  . COPD (chronic obstructive pulmonary disease) ([place])   . Coronary artery disease    post CABG x4 in June, 2001  . HTN (hypertension)   . Hypercholesterolemia   . Peripheral neuropathy ([place])   . Pulmonary HTN ([place])   . Right heart failure ([place])   . Sleep apnea   . Tachy-brady syndrome ([place])    a. s/p MDT dual chamber PPM    Past Surgical History:  Procedure Laterality Date  . CARDIAC CATHETERIZATION  10/03/1999   Est. EF of 65% -- Severe left main and two-vessel obstructive atherosclerotic coronary  -- Diffuse ectasia of the right coronary artery -- Normal left ventricular function  . CORONARY ARTERY BYPASS GRAFT  10/04/1999   x4 -- LIMA graft to LAD, saphenous vein graft to the diagonal, saphenous vein graft to the first OM vessel, and saphenous vein graft to the distal RCA  . EP IMPLANTABLE  DEVICE N/A 10/08/2014   MDT dual chamber PPM gen change by Dr Caryl Comes  . right knee surgery      Current Outpatient Prescriptions  Medication Sig Dispense Refill  . acetaminophen (TYLENOL) 325 MG tablet Take 650 mg by mouth as needed for mild pain or moderate pain.    Marland Kitchen atorvastatin (LIPITOR) 40 MG tablet Take 1 tablet (40 mg total) by mouth daily. 90 tablet 0  . budesonide-formoterol (SYMBICORT) 160-4.5 MCG/ACT inhaler TAKE 2 PUFFS TWICE A DAY 10.2 Inhaler 5  . ferrous sulfate 325 (65 FE) MG tablet Take 325 mg by mouth daily.      . furosemide (LASIX) 20 MG tablet TAKE 1 TABLET (20 MG TOTAL) BY MOUTH DAILY. 90 tablet 3  . gabapentin (NEURONTIN) 300 MG capsule Take 1 capsule (300 mg total) by mouth 2 (two) times daily. 180 capsule 1  . ipratropium (ATROVENT) 0.03 % nasal spray USE 1 SPRAY IN EACH NOSTRIL AT BEDTIME 30 mL 2  . ipratropium-albuterol (DUONEB) 0.5-2.5 (3) MG/3ML SOLN Use 1 vial in nebulizer up to 3 times daily and as needed. DX:  J43.2, J96.11 360 mL 11  . metoprolol succinate (TOPROL-XL) 50 MG 24 hr tablet Take 25 mg by mouth daily.     . Multiple Vitamin (MULTI-VITAMIN PO) Take 1 tablet by mouth daily.     . OXYGEN Inhale 2 L into the lungs continuous.    Marland Kitchen PRADAXA 150 MG CAPS capsule TAKE 1 CAPSULE (150 MG  TOTAL) BY MOUTH EVERY 12 (TWELVE) HOURS. 180 capsule 2  . PROAIR HFA 108 (90 Base) MCG/ACT inhaler INHALE 2 PUFFS 4 TIMES A DAY AS NEEDED 8.5 Inhaler 0  . tiotropium (SPIRIVA HANDIHALER) 18 MCG inhalation capsule PLACE 1 CAPSULE (18 MCG TOTAL) INTO INHALER AND INHALE DAILY. 30 capsule 5   No current facility-administered medications for this visit.     No Known Allergies  Review of Systems negative except from HPI and PMH  Physical Exam BP 128/76   Pulse 82   Ht 5\' 8"  (1.727 m)   Wt 176 lb 9.6 oz (80.1 kg)   SpO2 (!) 89%   BMI 26.85 kg/m  Well developed and well nourished in no acute distress HENT normal E scleral and icterus clear Neck Supple; carotids brisk  and full Clear to ausculation but decreased breath sounds Device pocket well healed; without hematoma or erythema.  There is no tethering Regular rate and rhythm, no murmurs gallops or rub Soft with active bowel sounds No clubbing cyanosis 2+ Edema Alert and oriented, grossly normal motor and sensory function Skin Warm and Dry  ECG    atrial fibrillation with ventricular pacing rate 72    Assessment and  Plan  Atrial fibrillation-permanent  Pacemaker-[employer]   The patient's device was interrogated.  The information was reviewed. No changes were made in the programming.    Complete heart block  Ischemic heart disease with prior CABG and normal LV function  HFpEF   We will give him a three-day trial of extra furosemide to see if we can improve his breathing at all.  We will check a metabolic profile and CBC on his anticoagulants.  Without symptoms of ischemia        This is a lamp

## 2016-01-16 LAB — CBC WITH DIFFERENTIAL/PLATELET
BASOS ABS: 66 {cells}/uL (ref 0–200)
BASOS PCT: 1 %
EOS PCT: 4 %
Eosinophils Absolute: 264 cells/uL (ref 15–500)
HCT: 43.6 % (ref 38.5–50.0)
HEMOGLOBIN: 14.7 g/dL (ref 13.2–17.1)
LYMPHS ABS: 1650 {cells}/uL (ref 850–3900)
Lymphocytes Relative: 25 %
MCH: 30.9 pg (ref 27.0–33.0)
MCHC: 33.7 g/dL (ref 32.0–36.0)
MCV: 91.6 fL (ref 80.0–100.0)
MPV: 11.7 fL (ref 7.5–12.5)
Monocytes Absolute: 594 cells/uL (ref 200–950)
Monocytes Relative: 9 %
NEUTROS ABS: 4026 {cells}/uL (ref 1500–7800)
Neutrophils Relative %: 61 %
PLATELETS: 227 10*3/uL (ref 140–400)
RBC: 4.76 MIL/uL (ref 4.20–5.80)
RDW: 14.2 % (ref 11.0–15.0)
WBC: 6.6 10*3/uL (ref 3.8–10.8)

## 2016-01-16 LAB — BASIC METABOLIC PANEL
BUN: 18 mg/dL (ref 7–25)
CO2: 22 mmol/L (ref 20–31)
Calcium: 9.1 mg/dL (ref 8.6–10.3)
Chloride: 104 mmol/L (ref 98–110)
Creat: 1.13 mg/dL — ABNORMAL HIGH (ref 0.70–1.11)
GLUCOSE: 90 mg/dL (ref 65–99)
POTASSIUM: 4.2 mmol/L (ref 3.5–5.3)
SODIUM: 140 mmol/L (ref 135–146)

## 2016-01-24 ENCOUNTER — Other Ambulatory Visit: Payer: Self-pay | Admitting: Pulmonary Disease

## 2016-01-27 ENCOUNTER — Other Ambulatory Visit: Payer: Self-pay | Admitting: Pulmonary Disease

## 2016-01-27 MED ORDER — BUDESONIDE-FORMOTEROL FUMARATE 160-4.5 MCG/ACT IN AERO: INHALATION_SPRAY | RESPIRATORY_TRACT | 11 refills | Status: DC

## 2016-02-01 ENCOUNTER — Other Ambulatory Visit: Payer: Self-pay | Admitting: *Deleted

## 2016-02-01 MED ORDER — METOPROLOL SUCCINATE ER 50 MG PO TB24: ORAL_TABLET | ORAL | 3 refills | Status: DC

## 2016-02-01 MED ORDER — ATORVASTATIN CALCIUM 40 MG PO TABS: 40.0000 mg | ORAL_TABLET | Freq: Every day | ORAL | 3 refills | Status: DC

## 2016-02-04 ENCOUNTER — Telehealth: Payer: Self-pay | Admitting: Pulmonary Disease

## 2016-02-04 MED ORDER — IPRATROPIUM-ALBUTEROL 0.5-2.5 (3) MG/3ML IN SOLN: RESPIRATORY_TRACT | 11 refills | Status: AC

## 2016-02-04 NOTE — Telephone Encounter (Signed)
Called and spoke with pt and he is aware of refill that has been sent to the [facility] [address].

## 2016-02-20 ENCOUNTER — Other Ambulatory Visit: Payer: Self-pay | Admitting: Pulmonary Disease

## 2016-02-22 DIAGNOSIS — E7800 Pure hypercholesterolemia, unspecified: Secondary | ICD-10-CM | POA: Diagnosis not present

## 2016-02-22 DIAGNOSIS — M21372 Foot drop, left foot: Secondary | ICD-10-CM | POA: Diagnosis not present

## 2016-02-22 DIAGNOSIS — J431 Panlobular emphysema: Secondary | ICD-10-CM | POA: Diagnosis not present

## 2016-02-22 DIAGNOSIS — I482 Chronic atrial fibrillation: Secondary | ICD-10-CM | POA: Diagnosis not present

## 2016-02-22 DIAGNOSIS — Z1389 Encounter for screening for other disorder: Secondary | ICD-10-CM | POA: Diagnosis not present

## 2016-02-22 DIAGNOSIS — G2581 Restless legs syndrome: Secondary | ICD-10-CM | POA: Diagnosis not present

## 2016-02-22 DIAGNOSIS — Z87891 Personal history of nicotine dependence: Secondary | ICD-10-CM | POA: Diagnosis not present

## 2016-02-22 DIAGNOSIS — Z23 Encounter for immunization: Secondary | ICD-10-CM | POA: Diagnosis not present

## 2016-02-22 DIAGNOSIS — I10 Essential (primary) hypertension: Secondary | ICD-10-CM | POA: Diagnosis not present

## 2016-03-16 ENCOUNTER — Other Ambulatory Visit: Payer: Self-pay

## 2016-03-16 MED ORDER — ALBUTEROL SULFATE HFA 108 (90 BASE) MCG/ACT IN AERS: INHALATION_SPRAY | RESPIRATORY_TRACT | 5 refills | Status: AC

## 2016-03-19 ENCOUNTER — Other Ambulatory Visit: Payer: Self-pay | Admitting: Pulmonary Disease

## 2016-04-13 ENCOUNTER — Telehealth: Payer: Self-pay | Admitting: Pulmonary Disease

## 2016-04-13 MED ORDER — BUDESONIDE-FORMOTEROL FUMARATE 160-4.5 MCG/ACT IN AERO: INHALATION_SPRAY | RESPIRATORY_TRACT | 5 refills | Status: AC

## 2016-04-13 NOTE — Telephone Encounter (Signed)
Spoke with pt. He is needing a refill on Symbicort. Rx has been sent in. Nothing further was needed. 

## 2016-04-19 ENCOUNTER — Ambulatory Visit (INDEPENDENT_AMBULATORY_CARE_PROVIDER_SITE_OTHER): Payer: Medicare Other | Admitting: *Deleted

## 2016-04-19 DIAGNOSIS — I442 Atrioventricular block, complete: Secondary | ICD-10-CM

## 2016-04-20 NOTE — Progress Notes (Signed)
Remote pacemaker transmission.   

## 2016-04-21 LAB — CUP PACEART REMOTE DEVICE CHECK
Battery Remaining Longevity: 142 mo
Brady Statistic RV Percent Paced: 94 %
Implantable Lead Implant Date: 20091218
Implantable Lead Location: 753860
Implantable Lead Model: 5076
Lead Channel Impedance Value: 628 Ohm
Lead Channel Impedance Value: 67 Ohm
Lead Channel Pacing Threshold Amplitude: 0.5 V
Lead Channel Setting Pacing Pulse Width: 0.4 ms
MDC IDC LEAD IMPLANT DT: 20091218
MDC IDC LEAD LOCATION: 753859
MDC IDC MSMT BATTERY IMPEDANCE: 136 Ohm
MDC IDC MSMT BATTERY VOLTAGE: 2.79 V
MDC IDC MSMT LEADCHNL RV PACING THRESHOLD PULSEWIDTH: 0.4 ms
MDC IDC PG IMPLANT DT: 20160615
MDC IDC SESS DTM: 20171226143019
MDC IDC SET LEADCHNL RV PACING AMPLITUDE: 2.5 V
MDC IDC SET LEADCHNL RV SENSING SENSITIVITY: 5.6 mV

## 2016-04-22 ENCOUNTER — Encounter: Payer: Self-pay | Admitting: Cardiology

## 2016-04-28 ENCOUNTER — Other Ambulatory Visit: Payer: Self-pay | Admitting: Pulmonary Disease

## 2016-05-06 ENCOUNTER — Encounter: Payer: Self-pay | Admitting: Cardiology

## 2016-05-23 ENCOUNTER — Encounter: Payer: Self-pay | Admitting: Pulmonary Disease

## 2016-05-23 ENCOUNTER — Ambulatory Visit (INDEPENDENT_AMBULATORY_CARE_PROVIDER_SITE_OTHER): Payer: Medicare Other | Admitting: Pulmonary Disease

## 2016-05-23 VITALS — BP 124/76 | HR 63 | Ht 68.5 in | Wt 175.4 lb

## 2016-05-23 DIAGNOSIS — I481 Persistent atrial fibrillation: Secondary | ICD-10-CM | POA: Diagnosis not present

## 2016-05-23 DIAGNOSIS — J432 Centrilobular emphysema: Secondary | ICD-10-CM | POA: Diagnosis not present

## 2016-05-23 DIAGNOSIS — I2510 Atherosclerotic heart disease of native coronary artery without angina pectoris: Secondary | ICD-10-CM | POA: Diagnosis not present

## 2016-05-23 DIAGNOSIS — J9611 Chronic respiratory failure with hypoxia: Secondary | ICD-10-CM

## 2016-05-23 DIAGNOSIS — Z951 Presence of aortocoronary bypass graft: Secondary | ICD-10-CM | POA: Diagnosis not present

## 2016-05-23 DIAGNOSIS — I2781 Cor pulmonale (chronic): Secondary | ICD-10-CM

## 2016-05-23 DIAGNOSIS — G4733 Obstructive sleep apnea (adult) (pediatric): Secondary | ICD-10-CM | POA: Diagnosis not present

## 2016-05-23 DIAGNOSIS — G2581 Restless legs syndrome: Secondary | ICD-10-CM

## 2016-05-23 DIAGNOSIS — I4819 Other persistent atrial fibrillation: Secondary | ICD-10-CM

## 2016-05-23 DIAGNOSIS — Z950 Presence of cardiac pacemaker: Secondary | ICD-10-CM

## 2016-05-23 MED ORDER — BUDESONIDE-FORMOTEROL FUMARATE 160-4.5 MCG/ACT IN AERO: 2.0000 | INHALATION_SPRAY | Freq: Two times a day (BID) | RESPIRATORY_TRACT | 0 refills | Status: DC

## 2016-05-23 NOTE — Progress Notes (Addendum)
Subjective:     Patient ID: Roy Banks, male   DOB: 11-18-1930, 83 y.o.   MRN: [id number]  HPI ~  October 10, 2014:  Initial pulmonary consult by SN>  45 y/o WM, referred by [facility] for on-going pulmonary evaluation & treatment> his PCP is Dr. Claris Gower; medical hx includes DM w/ neuropathy...      He was prev seen by [facility] & DrWert, last 12/14> former smoker, quit 1984, followed for COPD w/ CTChest 2014 showing centrilobular emphysema & prev PFTs w/ mod airflow obstruction (Chart notes- He is maintained on CPAP x 45yrs for OSA & reports good compliance.Spirometry showed moderate airway obstruction with FEV1 58%, post BD 65% & FVC of 86%);  He's had cough, beige sput, wheezing, DOE and treated w/ Symbicort160-2spBid, Spiriva daily, ProventilHFA as needed... He was referred to pulmonary rehab but he never participated... He receives occas Pred tapers during acute exac;        Followed by Kallie Edward for OSA, last 6/15> on CPAP x56yrs, good compliance, uses Klonopin for RLS;        ADM by [facility] 6/2 - 09/27/14 w/ hypoxemia & COPD exac, incr SOB but denied cough/ sput/ hemoptysis; found to have right heart failure/ PulmHTN;  Treated w/ Pred, Doxy, Oxygen started this adm, & continuation of Symbicort160, Spiriva, Proair;  He was diuresed w/ Lasix & asked to f/u w/ Cards to consider right heart cath...       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 Pacer generator changed 10/08/14 by DrKlein...  EXAM showed Afeb, VSS, O2sat=91% on O2 at 2L/min;  HEENT- neg, mallampati2;  Chest- decr BS but w/o w/r/r;  Heart- pacer, RR, gr1/6SEM w/o r/g;  Abd- soft, neg;  Ext- trace edema w/o  c/c...  Sleep study 12/1998 at [facility] reported mild OSA w/ RDI=9/hr, loud snoring, PLMS=5.5/hr, & lowest O2sat=91% on RA... Started on CPAP- details unknown, & Klonopin for RLS...  PFT> can't find prev study but chart notes "Spirometry showed moderate airway obstruction with FEV1 58%, post BD 65% & FVC of 86%"  CTChest 08/28/12 showed borderline heart size, no signif adenopathy, modHH, atherosclerosis in Ao, coronaries (including [place]), & s/pCABG; mod to severe centrilobular emphysema, mild biapical pleuroparenchymal thickening, no other lung lesions; large right renal cyst...   CXR 09/25/14 showed s/pCABG w/ pacer in place, trace bilat effus, hyperinflated w/ incr markings, NAD...   2DEcho 6/16 showed mildLVH, norm LVF w/ EF=60%, AVsclerosis w/o stenosis, mod to severe LAdil, RVdil w/ mod reduction in RV function & est PAsys~54mmHg  LABS 6/16 showed elevBS, BNP=1150  Ambulatory oxygen saturation test> On 2L/min his O2sat=98% w/ pulse=61/min;  Walked 1 lap w/ O2sat=91% w/ pulse=76/min... IMP/PLAN>>   Roy Banks has COPD/emphysema on Symbicort, Spiriva, ProairHFA (he finished prev Doxy, Pred);  OSA on CPAP & we will contact [employer] for machine download data;  CorPulmonale w/ right heart failure and pulm art HTN=> on O2 at 2L/min 24/7 now & continue Lasix20...  ADDENDUM>> CPAP download date from [employer]> 5/6 - 09/27/14=> CPAP=7, 28/30 days, 7-8h per night, AHI=2.0; good compliance & effectiveness confirmed...   ~  November 12, 2014:  63mo OV w/ SN>   Roy Banks has  moved to [place] he tells me that he still prefers to get his medical care here in [place];  His breathing is OK on his O2 at 2L/min 24/7 via [facility] Romans- it is bled into his CPAP at night & using bottles during the day- he needs pulse dose set up & may be better served w/ POC so we will inquire on his behalf...     COPD/emphysema> on Symbicort160-2spBid, Spiriva daily, ProairHFA prn (off prev Doxy, Pred); CT 2014 w/ mod to severe centrilob emphysema    OSA on  CPAP> download from [place] 5/16 showed CPAP=7 (he says 5), good compliance & effectiveness confirmed w/ AHI=2...    Right heart failure and pulmonary HTN> 2DEcho 6/16 w/ RVdil & mod reduced RV function & PAsys est~43mmHg; started on O2 and diuresed w/ Lasix20; DrKlein incr to Lasix20Bid transiently...    CAD- s/p CABG> followed by DrJordan for Cards on MetopER50-1/2, Lasix20/d; last OV 10/2013 & due for yearly check up soon...    AFib,Hx tachy-brady w/ pacer> on Pradaxa & followed by DrKlein; generator changed 10/08/14 & doing satis, he does tele-pacer checks monthly...    DM w/ neuropathy> on diet alone for his DM (BS in hosp 120-160, no A1c in Epic); on Neurontin300Qhs...     Hiatus hernia> aware, not currently on meds, denies reflux, heartburn, dysphagia, n/v, d/c, blood seen...       Renal cyst EXAM shows Afeb, VSS, O2sat=92% on 2l/min at rest;  HEENT- neg, mallampati2;  Chest- decr BS but w/o w/r/r;  Heart- pacer, RR, gr1/6SEM w/o r/g;  Abd- soft, neg;  Ext- trace edema w/o c/c... IMP/PLAN>>  No labs done today, he has upcoming appt w/ DrJordan, Lasix20 refilled & reminded NO SALT, we will arrange for ONO on CPAP/ on O2 to confirm no hypoxemia at night, needs pulse dose set up & he would benefit from POC;  Plan ROV 79mo, sooner prn... ADDENDUM>>  ONO done 12/20/14 on CPAP & on O2 at 2L/min showed resolution of the nocturnal hypoxemia- mean O2sat=94.7%, time <88% was 51min36sec (0.4% of the recording) and he was >90% for 99.5%; continue same.  ~  January 13, 2015:  20mo ROV w/ SN>   Roy Banks returns and reports a stable 63mo interval- he remains on O2at 2L/min, Symbicort160-2spBid, Spriva daily, AlbutHFA as needed; he notes that he is not resting as well recently & blames a noise his CPAP machine makes now that the oxygen is bled-in; he also has c/o mask fit => we will ask [employer] to check his machine & mask...  [name] also notes right leg pain (esp at night) & he was taking Gabapentin 300mg Qhs but wondered if  this was the cause so he stopped it; we discussed this & noted how this is a low dose for neuropathy problems asking him to try increasing the medication to 2 tabsQhs; wife notes more of a prob from RLS by her description...     He remains on ToprolXL25, Lasix20, Pradaxa150Bid; CAD, s/p CABG, AFib, hx tachy-brady w/ pacer, right heart failure & PulmHTN...    He had f/u DrJordan for Cards 8/16> note reviewed, they stressed sodium restriction and continued the same meds...    He also had a f/u appt w/ DrKlein 8/16> note reviewed, normal pacer function, rec to continue same meds & teletrace...  EXAM shows Afeb, VSS, O2sat=95% on 2l/min at rest;  HEENT- neg, mallampati1;  Chest- decr BS but w/o w/r/r;  Heart- pacer, irreg, gr1/6SEM w/o r/g;  Abd- soft,  neg;  Ext- trace edema w/o c/c... IMP/PLAN>>  We discussed his medications and the only rec change today is to try incr the Gabapentin from 300mg Qhs to 600gmQhs & see if we can diminish his nocturnal leg discomfort; he will also restart an exercise program rec by a former chiro to help his back & legs...  ~  May 18, 2015:  52mo ROV w/SN>   Roy Banks continues to come from [facility] to [place] for his pulmonary follow-up- he has a new PCP Dr. Magdalene Molly in [place], [place] (copy of this summary note sent to him)...  Pt reports that he uses CPAP 7-10 Hrs per night & breathing is stable, he says [employer] sent him a note indicating that he needs to have 2 tests done but he left the letter at home 7 dosen't known what they are- we did not receive a similar note from them; he is asked to CALL us once he gets home & retreived the letter...     Zayd had one interval visit- CARDS f/u DrJordan 03/02/15>  Followed for CAD- CABG2001, AFib & tachy-brady w/ pacemaker 2009 & generator revision 6/16;  MYOVIEW 09/2011 was NEG;  MEDS+ Pradaxa150Bid, MetoprololER25, Lasix20;  He has PulmHTN on 2DEcho (last 6/16 PAsys=75)- on Home O2; no change in meds...    COPD/emphysema> on  Symbicort160-2spBid, Spiriva daily, ProairHFA prn (not often); CT 2014 w/ mod to severe centrilob emphysema; breathing stable on meds...    OSA on CPAP> download from [place] 5/16 showed CPAP=7 (he says 5), good compliance & effectiveness confirmed w/ AHI=2; he needs to keep up w/ mask changes and machine checks thru [place]...    Right heart failure and pulmonary HTN> 2DEcho 6/16 w/ RVdil & mod reduced RV function & PAsys est~24mmHg; started on O2 and diuresed w/ Lasix20; DrKlein incr to Lasix20Bid transiently.     CAD- s/p CABG> followed by DrJordan for Cards on MetopER50-1/2, Lasix20/d; last OV 02/2015 as above 7 no changes made.    AFib,Hx tachy-brady w/ pacer> on Pradaxa & followed by DrKlein; generator changed 10/08/14 & doing satis, he does tele-pacer checks monthly...    DM w/ neuropathy> on diet alone for his DM (BS in hosp 120-160, no A1c in Epic); on Neurontin300Bid now & improved...     Hiatus hernia> aware, not currently on meds, denies reflux, heartburn, dysphagia, n/v, d/c, blood seen...       Renal cyst> aware, Cr is wnl at 1.0-1.1.Marland KitchenMarland Kitchen EXAM shows Afeb, VSS, O2sat=90% on 2l/min at rest;  HEENT- neg, mallampati1;  Chest- decr BS but w/o w/r/r;  Heart- pacer, irreg, gr1/6SEM w/o r/g;  Abd- soft, neg;  Ext- trace edema w/o c/c... IMP/PLAN>>  Trysten will work w/ his DME at [employer] regarding mask fit & machine function... He needs to incr exercise program as if in [place] program... Same meds and we will plan f/u 2DEcho prior to his ROV...  ADDENDUM>>  2DEcho done 08/18/15>> norm LV size, LVF, & wall thickness w/ EF=55-60% (no regional wall motion abn); Gr2DD, AoV ok, MV- mildMR, mod LA&RA dil, mod RV dil w/ decr RV function, PAsys=48mmHg (improved from 19mmHg 09/2014).  ~  November 19, 2015:  88mo ROV w/ SN>  Roy Banks's new PCP is Dr. Elsie Amis in [place]... Hartsel has severe COPD/emphysema, OSA, CAD-s/pCABG, Afib/ tachy-brady w/ pacer inserted, etc;  He had a good 18mo interval w/ only one episode of  increased SOB occurring 2-3wks ago assoc w/ min cough, some beige sput, no hemoptysis/ CP/ f/c/s/  etc;  He remains on O2 at 2L/min flow & has been taking Symbicort160-2spBid & Spiriva daily; he used his ProairHFA rescue inhaler during the episode of incr SOB & found that this worked well for him- now resolved; we discussed trial of a home NEBULIZER w/ Duoneb to use up to 3 times daily as needed...     We have had some difficulty getting his DME company to keep up w/ CPAP mask & tubing supplies, but says it's due to [place] not paying the bill- we will ask our [facility] to look into this for him...    Otherw pt is stable from the pulmonary standpoint & we reviewed the need to stay on meds regularly & engage in a regular exercise program... EXAM shows Afeb, VSS, O2sat=95% on 2l/min at rest;  HEENT- neg, mallampati1;  Chest- decr BS but w/o w/r/r;  Heart- pacer, irreg, gr1/6SEM w/o r/g;  Abd- soft, neg;  Ext- trace edema w/o c/c  He indicates that he had recent labs from his [facility] PCP at the [facility]...  IMP/PLAN>>  We have requested a home nebulizer w/ Duoneb to use Tid prn at home; he will continue his other meds the same, and increase his exercise program...    ~  May 23, 2016:  76mo ROV & pulmonary follow up visit>  Roy Banks reports a good interval & breathing at baseline w/ min cough, scant sput, no hemoptysis, chr stable DOE, denies CP & f/c/s etc... 2 issues he mentions today include obtaining a POC for his ambulatory oxygen system (he uses [employer]) and getting a 76mo supply of Symbicort for free from [employer] (40yr supply for 0 dollars at [email or web address])... we reviewed the following medical problems during today's office visit >>     COPD/emphysema> on NEB w/ Duoneb Tid (does it Bid), Symbicort160-2spBid, Spiriva daily, ProairHFA prn (not often); CT 2014 w/ mod to severe centrilob emphysema; breathing stable on meds...    OSA on CPAP> download from [place] 5/16 showed CPAP=7 (he says 5), good compliance &  effectiveness confirmed w/ AHI=2; he needs to keep up w/ mask changes and machine checks thru [place]...    Right heart failure and pulmonary HTN> 2DEcho 6/16 w/ RVdil & mod reduced RV function & PAsys est~21mmHg; started on O2 and diuresed w/ Lasix20; repeat 2DEcho 4/17 showed PAsys improved to 84mmHg.    CAD- s/p CABG> followed by DrJordan for Cards on MetopER50-1/2, Lasix20/d; last OV 02/2015 as above & no changes made.    AFib,Hx tachy-brady w/ pacer> on Pradaxa & followed by DrKlein; generator changed 10/08/14 & doing satis, he does tele-pacer checks monthly...    DM w/ neuropathy> on diet alone for his DM (BS in hosp 120-160, no A1c in Epic); on Neurontin300Bid now & improved...     Hiatus hernia> aware, not currently on meds, denies reflux, heartburn, dysphagia, n/v, d/c, blood seen...       Renal cyst> aware, Cr is wnl at 1.0-1.1.Marland KitchenMarland Kitchen    RLS & foot drop> on Gabapentin300Bid, and L>R foot drop makes balance off... EXAM shows Afeb, VSS, O2sat=99% on 2l/min at rest;  HEENT- neg, mallampati1;  Chest- decr BS but w/o w/r/r;  Heart- pacer, irreg, gr1/6SEM w/o r/g;  Abd- soft, neg;  Ext- trace edema w/o c/c... IMP/PLAN>>  Roy Banks is +- stable on his current regimen & he is encouraged to increase the NEB Rx to TID followed by the Symbicort Bid & Spiriva once daily; he will continue the Mucinex as he feels this is helping  to loosen the phlegm & make it easier to expectorate; we will contact [place] regarding an INOGEN POC for his use & we will help him enroll in the [place]&ME program for free med if he indeed qualifies; finally we spent some time outlining a good exercise program for him since a formal PulmRehab program is not avail near his home- we discussed walking outside in a progressively increased manor & arrange for indoor exercise on a treadmill vs bike to use when the weather does not cooperate... He will call for any problems and we plan recheck 36mo. NOTE: O2 Qualifying-- pt has severe COPD/emphysema w/  CorPulmonale- right heart failure and secondary pulm HTN- on O2 24/7 & needs this intervention & clearly benefits from the O2 w/ some decr PAsys noted; REC to continue O2 at 2L/min 24/7...  ADDENDUM>>  09/28/16>  I received letter from Mrs. Shankar (and I called to discuss w/ her) telling me that she noticed a change in a mole on his upper leg=> work up showed a Melanoma w/ mets to bone & lung (Stage IV disease);  He is seeing an Materials engineer in [facility] area & has established w/ a Cardiologist there; she will inquire about a Pulmonologist & let us know when to send records... He is starting on OPDIVO per oncology & I related a recent experience w/ a pt who responded nicely to [facility] for the same cancer;  I told her we are avail to help in any way possible & they are certainly in our thoughts and prayers.    Past Medical History  Diagnosis Date  . HTN (hypertension) >> on MetoprololER50-1/2Qd, Lasix20   . Hypercholesterolemia >> on Lipitor40   . Sleep apnea >> on CPAP followed by DrAlva   . COPD (chronic obstructive pulmonary disease) w/ CorPulmonale- O2 started 09/2014 on 2L/min    . Coronary artery disease >> followed by DrJordan     post CABG x4 in June, 2001  . Tachy-brady syndrome   . Atrial fibrillation >> on Pradaxa 150Bid     post DDD pacemaker implant >> followed by DrKlein  . Peripheral neuropathy >> on Neurontin 300Bid     Past Surgical History:  Procedure Laterality Date  . CARDIAC CATHETERIZATION  10/03/1999   Est. EF of 65% -- Severe left main and two-vessel obstructive atherosclerotic coronary  -- Diffuse ectasia of the right coronary artery -- Normal left ventricular function  . CORONARY ARTERY BYPASS GRAFT  10/04/1999   x4 -- LIMA graft to LAD, saphenous vein graft to the diagonal, saphenous vein graft to the first OM vessel, and saphenous vein graft to the distal RCA  . EP IMPLANTABLE DEVICE N/A 10/08/2014   MDT dual chamber PPM gen change by Dr Caryl Comes  . right knee surgery       Outpatient Encounter Prescriptions as of 05/23/2016  Medication Sig  . acetaminophen (TYLENOL) 325 MG tablet Take 650 mg by mouth as needed for mild pain or moderate pain.  Marland Kitchen albuterol (PROAIR HFA) 108 (90 BASE) MCG/ACT inhaler Inhale 2 puffs into the lungs every 4 (four) hours as needed for wheezing or shortness of breath.  Marland Kitchen atorvastatin (LIPITOR) 40 MG tablet Take 1 tablet (40 mg total) by mouth daily.  . budesonide-formoterol (SYMBICORT) 160-4.5 MCG/ACT inhaler TAKE 2 PUFFS TWICE A DAY  . ferrous sulfate 325 (65 FE) MG tablet Take 325 mg by mouth daily.    . furosemide (LASIX) 20 MG tablet TAKE 1 TABLET (20 MG TOTAL) BY MOUTH  DAILY.  . gabapentin (NEURONTIN) 300 MG capsule Take 1 capsule (300 mg total) by mouth 2 (two) times daily.  Marland Kitchen ipratropium (ATROVENT) 0.03 % nasal spray USE 1 SPRAY IN EACH NOSTRIL AT BEDTIME  . metoprolol succinate (TOPROL-XL) 50 MG 24 hr tablet Take 25 mg by mouth daily.   . Multiple Vitamin (MULTI-VITAMIN PO) Take 1 tablet by mouth daily.   . OXYGEN Inhale 2 L into the lungs continuous.  Marland Kitchen PRADAXA 150 MG CAPS capsule TAKE 1 CAPSULE (150 MG TOTAL) BY MOUTH EVERY 12 (TWELVE) HOURS.  Marland Kitchen tiotropium (SPIRIVA HANDIHALER) 18 MCG inhalation capsule PLACE 1 CAPSULE (18 MCG TOTAL) INTO INHALER AND INHALE DAILY.    No Known Allergies   Immunization History  Administered Date(s) Administered  . Influenza Split 01/23/2013, 01/21/2015  . Influenza, High Dose Seasonal PF 01/24/2016  . Influenza,inj,Quad PF,36+ Mos 04/03/2014  . Pneumococcal Conjugate-13 01/24/2016    Current Medications, Allergies, Past Medical History, Past Surgical History, Family History, and Social History were reviewed in [employer] record.   Review of Systems            All symptoms NEG except where BOLDED >>  Constitutional:  F/C/S, fatigue, anorexia, unexpected weight change. HEENT:  HA, visual changes, hearing loss, earache, nasal symptoms, sore throat, mouth  sores, hoarseness. Resp:  cough, sputum, hemoptysis; SOB, tightness, wheezing. Cardio:  CP, palpit, DOE, orthopnea, edema. GI:  N/V/D/C, blood in stool; reflux, abd pain, distention, gas. GU:  dysuria, freq, urgency, hematuria, flank pain, voiding difficulty. MS:  joint pain, swelling, tenderness, decr ROM; neck pain, back pain, etc. Neuro:  HA, tremors, seizures, dizziness, syncope, weakness, numbness, gait abn. Skin:  suspicious lesions or skin rash. Heme:  adenopathy, bruising, bleeding. Psyche:  confusion, agitation, sleep disturbance, hallucinations, anxiety, depression suicidal.   Objective:   Physical Exam      Vital Signs:  Reviewed...   General:  WD, WN, 83 y/o WM in NAD; alert & oriented; pleasant & cooperative... HEENT:  [place]/AT; Conjunctiva- pink, Sclera- nonicteric, EOM-wnl, PERRLA, EACs-clear, TMs-wnl; NOSE-clear; THROAT-clear & wnl.  Neck:  Supple w/ fair ROM; no JVD; normal carotid impulses w/o bruits; no thyromegaly or nodules palpated; no lymphadenopathy.  Chest:  decr BS bilat; without wheezes, rales, or rhonchi heard. Heart:  irreg; pacer, without murmurs, rubs, or gallops detected. Abdomen:  Soft & nontender- no guarding or rebound; normal bowel sounds; no organomegaly or masses palpated. Ext:  Normal ROM; without deformities or arthritic changes; no varicose veins, venous insuffic, or edema;  Pulses intact w/o bruits. Neuro:  No focal neuro deficits; gait normal & balance OK. Derm:  No lesions noted; no rash etc. Lymph:  No cervical, supraclavicular, axillary, or inguinal adenopathy palpated.   Assessment:      IMP >>    COPD/emphysema ==> continue inhalers (Symbicort160, Spiriva), O2 at 2L/min, exercise, & we added NEB w/ Duoneb prn...    OSA on CPAP ==> continue CPAP & O2 at 2L/min 24/7 & ONO check was ok...    Right heart failure and pulmonary HTN ==> continue Lasix20 & O2; f/u 2DEcho 07/2015 showed a decr in PAsys to 80mmHg (down from 75).    CAD- s/p CABG =>  per DrJordan    AFib, Hx tachy-brady w/ pacer => per DrKlein w/ generator change 09/2014.    DM w/ neuropathy => on Gabapentin300Bid.    Hiatus hernia    Renal cyst  PLAN >>  10/10/14>   Roy Banks has COPD/emphysema on Symbicort, Spiriva,  ProairHFA (he finished prev Doxy, Pred);  OSA on CPAP & we will contact [place] for machine download data;  CorPulmonale w/ right heart failure and pulm art HTN=> on O2 at 2L/min 24/7 now & continue Lasix20...  01/13/15>   We discussed his medications and the only rec change today is to try incr the Gabapentin from 300mg Qhs to 600gmQhs & see if we can diminish his nocturnal leg discomfort; he will also restart an exercise program rec by a former chiro to help his back & legs... 05/18/15>   Roy Banks will work w/ his DME at [employer] regarding mask fit & machine function... He needs to incr exercise program as if in [place] program... Same meds and we will plan f/u 2DEcho prior to his ROV...  11/19/15>   We have requested a home nebulizer w/ Duoneb to use Tid prn at home; he will continue his other meds the same, and increase his exercise program...  05/23/16>   Roy Banks is +- stable on his current regimen & he is encouraged to increase the NEB Rx to TID followed by the Symbicort Bid & Spiriva once daily; he will continue the Mucinex as he feels this is helping to loosen the phlegm & make it easier to expectorate; we will contact [place] regarding an [place] POC for his use & we will help him enroll in the [place]&ME program for free med if he indeed qualifies; finally we spent some time outlining a good exercise program for him since a formal PulmRehab program is not avail near his home- we discussed walking outside in a progressively increased manor & arrange for indoor exercise on a treadmill vs bike to use when the weather does not cooperate... He will call for any problems and we plan recheck 67mo. NOTE: O2 Qualifying-- pt has severe COPD/emphysema w/ CorPulmonale- right heart failure and  secondary pulm HTN- on O2 24/7 & needs this intervention & clearly benefits from the O2 w/ some decr PAsys noted; REC to continue O2 at 2L/min 24/7...     Plan:     Patient's Medications  New Prescriptions   BUDESONIDE-FORMOTEROL (SYMBICORT) 160-4.5 MCG/ACT INHALER    Inhale 2 puffs into the lungs 2 (two) times daily.  Previous Medications   ACETAMINOPHEN (TYLENOL) 325 MG TABLET    Take 650 mg by mouth as needed for mild pain or moderate pain.   ALBUTEROL (PROAIR HFA) 108 (90 BASE) MCG/ACT INHALER    INHALE 2 PUFFS 4 TIMES A DAY AS NEEDED   ATORVASTATIN (LIPITOR) 40 MG TABLET    Take 1 tablet (40 mg total) by mouth daily.   BUDESONIDE-FORMOTEROL (SYMBICORT) 160-4.5 MCG/ACT INHALER    TAKE 2 PUFFS TWICE A DAY   FERROUS SULFATE 325 (65 FE) MG TABLET    Take 325 mg by mouth daily.     FUROSEMIDE (LASIX) 20 MG TABLET    TAKE 1 TABLET (20 MG TOTAL) BY MOUTH DAILY.   GABAPENTIN (NEURONTIN) 300 MG CAPSULE    TAKE 1 CAPSULE (300 MG TOTAL) BY MOUTH 2 (TWO) TIMES DAILY.   IPRATROPIUM (ATROVENT) 0.03 % NASAL SPRAY    USE 1 SPRAY IN EACH NOSTRIL AT BEDTIME   IPRATROPIUM-ALBUTEROL (DUONEB) 0.5-2.5 (3) MG/3ML SOLN    Use 1 vial in nebulizer up to 3 times daily and as needed. DX:  J43.2, J96.11   METOPROLOL SUCCINATE (TOPROL-XL) 50 MG 24 HR TABLET    TAKE 1/2 TABLET BY MOUTH DAILY.   MULTIPLE VITAMIN (MULTI-VITAMIN PO)    Take  1 tablet by mouth daily.    OXYGEN    Inhale 2 L into the lungs continuous.   PRADAXA 150 MG CAPS CAPSULE    TAKE 1 CAPSULE (150 MG TOTAL) BY MOUTH EVERY 12 (TWELVE) HOURS.   SPIRIVA HANDIHALER 18 MCG INHALATION CAPSULE    PLACE 1 CAPSULE (18 MCG TOTAL) INTO INHALER AND INHALE DAILY.   SYMBICORT 160-4.5 MCG/ACT INHALER    TAKE 2 PUFFS TWICE A DAY  Modified Medications   No medications on file  Discontinued Medications   No medications on file

## 2016-05-23 NOTE — Patient Instructions (Signed)
Today we updated your med list in our EPIC system...    Continue your current medications the same...  We will ask our [facility] to contact [place] regarding a Portable Oxygen Concentrator (POC)...  We will check w/ the [place] regarding their 12 month free program...  Continue the NEBULIZER 3 times daily followed by the Symbicort twice & the Spiriva once...  We discussed the need for a regular exercise program>    Walking outdoors when the weather is fine...    And exercising indoors on a treadmill when the weather is too hot/ cold/ wet...  Call for any questions...  Let's plan a follow up visit in 30mo, sooner if needed for problems.Marland KitchenMarland Kitchen

## 2016-05-31 ENCOUNTER — Other Ambulatory Visit: Payer: Self-pay | Admitting: Internal Medicine

## 2016-06-02 ENCOUNTER — Telehealth: Payer: Self-pay | Admitting: Cardiology

## 2016-06-02 NOTE — Telephone Encounter (Signed)
New Message   Per pt wife pt is currently on PRADAXA 150 MG CAPS capsule, and needing nurse to call [employer] Clinical Review/[phone] so they will drop from tier 4 to tier 3.  Patient's Member ID [id number]

## 2016-06-02 NOTE — Telephone Encounter (Signed)
Returned call to patient's wife.She stated insurance has raised pradaxa to a Tier 4 which cost $ 430.Stated Plavix is a Tier 2.Advised I will speak to [place] tomorrow and call her back.

## 2016-06-10 NOTE — Telephone Encounter (Signed)
Spoke to [facility] regarding Pradaxa tier reduction.Ref # [id number].Office will be notified in 24 to 72 hours of approval.

## 2016-06-20 ENCOUNTER — Telehealth: Payer: Self-pay | Admitting: Cardiology

## 2016-06-20 NOTE — Telephone Encounter (Signed)
Spoke to patient's wife.[employer] insurance denied pradaxa tier reduction.Stated he will continue taking pradaxa.Stated he did not want to take coumadin.

## 2016-06-20 NOTE — Telephone Encounter (Signed)
Patient's wife called no answer.[place].

## 2016-06-20 NOTE — Telephone Encounter (Signed)
New message ° ° ° °Pt wife verbalized that she is returning call for rn °

## 2016-06-20 NOTE — Telephone Encounter (Signed)
FORWARD TO CHERYL 

## 2016-06-21 NOTE — Telephone Encounter (Signed)
See previous 06/21/16 note.

## 2016-06-22 DIAGNOSIS — E7800 Pure hypercholesterolemia, unspecified: Secondary | ICD-10-CM | POA: Diagnosis not present

## 2016-06-22 DIAGNOSIS — I482 Chronic atrial fibrillation: Secondary | ICD-10-CM | POA: Diagnosis not present

## 2016-06-22 DIAGNOSIS — I10 Essential (primary) hypertension: Secondary | ICD-10-CM | POA: Diagnosis not present

## 2016-06-29 ENCOUNTER — Telehealth: Payer: Self-pay | Admitting: Pulmonary Disease

## 2016-06-29 NOTE — Telephone Encounter (Signed)
Spoke with Tiffany at [facility], states that she has faxed over a chart notes request for OV notes to our office a few times and have yet to receive any notes. Per Tiffany she just needs last OV notes with O2 mentioned.  After reviewing the patient's most recent appt with SN 05/23/16, there is not mention of use and benefit from O2, Tiffany states that this is needed for insurance purposes for O2 coverage.   Please advise Dr Lenna Gilford. Thanks.

## 2016-07-04 DIAGNOSIS — I482 Chronic atrial fibrillation: Secondary | ICD-10-CM | POA: Diagnosis not present

## 2016-07-04 DIAGNOSIS — M21372 Foot drop, left foot: Secondary | ICD-10-CM | POA: Diagnosis not present

## 2016-07-04 DIAGNOSIS — E039 Hypothyroidism, unspecified: Secondary | ICD-10-CM | POA: Diagnosis not present

## 2016-07-04 DIAGNOSIS — R0781 Pleurodynia: Secondary | ICD-10-CM | POA: Diagnosis not present

## 2016-07-04 DIAGNOSIS — J431 Panlobular emphysema: Secondary | ICD-10-CM | POA: Diagnosis not present

## 2016-07-04 DIAGNOSIS — E7800 Pure hypercholesterolemia, unspecified: Secondary | ICD-10-CM | POA: Diagnosis not present

## 2016-07-04 DIAGNOSIS — G2581 Restless legs syndrome: Secondary | ICD-10-CM | POA: Diagnosis not present

## 2016-07-04 DIAGNOSIS — Z1389 Encounter for screening for other disorder: Secondary | ICD-10-CM | POA: Diagnosis not present

## 2016-07-04 DIAGNOSIS — I10 Essential (primary) hypertension: Secondary | ICD-10-CM | POA: Diagnosis not present

## 2016-07-08 NOTE — Telephone Encounter (Signed)
SN please advise. thanks 

## 2016-07-15 NOTE — Telephone Encounter (Signed)
Called and spoke with Tiffany and she stated that she has everything that she needs.  Nothing further is needed.

## 2016-07-15 NOTE — Telephone Encounter (Signed)
SN please advise if these notes have been updated to document 02 usage and benefit.  Thanks!

## 2016-07-19 ENCOUNTER — Ambulatory Visit (INDEPENDENT_AMBULATORY_CARE_PROVIDER_SITE_OTHER): Payer: Medicare Other | Admitting: *Deleted

## 2016-07-19 DIAGNOSIS — H524 Presbyopia: Secondary | ICD-10-CM | POA: Diagnosis not present

## 2016-07-19 DIAGNOSIS — I442 Atrioventricular block, complete: Secondary | ICD-10-CM | POA: Diagnosis not present

## 2016-07-19 DIAGNOSIS — H25043 Posterior subcapsular polar age-related cataract, bilateral: Secondary | ICD-10-CM | POA: Diagnosis not present

## 2016-07-19 DIAGNOSIS — H43813 Vitreous degeneration, bilateral: Secondary | ICD-10-CM | POA: Diagnosis not present

## 2016-07-19 DIAGNOSIS — H2513 Age-related nuclear cataract, bilateral: Secondary | ICD-10-CM | POA: Diagnosis not present

## 2016-07-19 NOTE — Progress Notes (Signed)
Remote pacemaker transmission.   

## 2016-07-20 LAB — CUP PACEART REMOTE DEVICE CHECK
Battery Impedance: 135 Ohm
Battery Voltage: 2.78 V
Brady Statistic RV Percent Paced: 93 %
Date Time Interrogation Session: 20180327095340
Implantable Lead Location: 753860
Implantable Lead Model: 5076
Implantable Lead Model: 5076
Implantable Pulse Generator Implant Date: 20160615
Lead Channel Impedance Value: 615 Ohm
Lead Channel Impedance Value: 67 Ohm
Lead Channel Pacing Threshold Amplitude: 0.5 V
Lead Channel Pacing Threshold Pulse Width: 0.4 ms
Lead Channel Setting Pacing Amplitude: 2.5 V
MDC IDC LEAD IMPLANT DT: 20091218
MDC IDC LEAD IMPLANT DT: 20091218
MDC IDC LEAD LOCATION: 753859
MDC IDC MSMT BATTERY REMAINING LONGEVITY: 142 mo
MDC IDC SET LEADCHNL RV PACING PULSEWIDTH: 0.4 ms
MDC IDC SET LEADCHNL RV SENSING SENSITIVITY: 5.6 mV

## 2016-07-22 ENCOUNTER — Encounter: Payer: Self-pay | Admitting: Cardiology

## 2016-08-02 DIAGNOSIS — E7800 Pure hypercholesterolemia, unspecified: Secondary | ICD-10-CM | POA: Diagnosis not present

## 2016-08-02 DIAGNOSIS — Z87891 Personal history of nicotine dependence: Secondary | ICD-10-CM | POA: Diagnosis not present

## 2016-08-02 DIAGNOSIS — I482 Chronic atrial fibrillation: Secondary | ICD-10-CM | POA: Diagnosis not present

## 2016-08-02 DIAGNOSIS — I2510 Atherosclerotic heart disease of native coronary artery without angina pectoris: Secondary | ICD-10-CM | POA: Diagnosis not present

## 2016-08-02 DIAGNOSIS — Z7901 Long term (current) use of anticoagulants: Secondary | ICD-10-CM | POA: Diagnosis not present

## 2016-08-02 DIAGNOSIS — J449 Chronic obstructive pulmonary disease, unspecified: Secondary | ICD-10-CM | POA: Diagnosis not present

## 2016-08-02 DIAGNOSIS — I495 Sick sinus syndrome: Secondary | ICD-10-CM | POA: Diagnosis not present

## 2016-08-23 DIAGNOSIS — Z45010 Encounter for checking and testing of cardiac pacemaker pulse generator [battery]: Secondary | ICD-10-CM | POA: Diagnosis not present

## 2016-08-29 DIAGNOSIS — J431 Panlobular emphysema: Secondary | ICD-10-CM | POA: Diagnosis not present

## 2016-08-29 DIAGNOSIS — D2272 Melanocytic nevi of left lower limb, including hip: Secondary | ICD-10-CM | POA: Diagnosis not present

## 2016-08-29 DIAGNOSIS — R590 Localized enlarged lymph nodes: Secondary | ICD-10-CM | POA: Diagnosis not present

## 2016-08-29 DIAGNOSIS — I482 Chronic atrial fibrillation: Secondary | ICD-10-CM | POA: Diagnosis not present

## 2016-08-30 DIAGNOSIS — L089 Local infection of the skin and subcutaneous tissue, unspecified: Secondary | ICD-10-CM | POA: Diagnosis not present

## 2016-08-30 DIAGNOSIS — C4372 Malignant melanoma of left lower limb, including hip: Secondary | ICD-10-CM | POA: Diagnosis not present

## 2016-08-30 DIAGNOSIS — R896 Abnormal cytological findings in specimens from other organs, systems and tissues: Secondary | ICD-10-CM | POA: Diagnosis not present

## 2016-08-30 DIAGNOSIS — D485 Neoplasm of uncertain behavior of skin: Secondary | ICD-10-CM | POA: Diagnosis not present

## 2016-09-05 DIAGNOSIS — C4372 Malignant melanoma of left lower limb, including hip: Secondary | ICD-10-CM | POA: Diagnosis not present

## 2016-09-07 DIAGNOSIS — C4372 Malignant melanoma of left lower limb, including hip: Secondary | ICD-10-CM | POA: Diagnosis not present

## 2016-09-08 DIAGNOSIS — R911 Solitary pulmonary nodule: Secondary | ICD-10-CM | POA: Diagnosis not present

## 2016-09-08 DIAGNOSIS — C4372 Malignant melanoma of left lower limb, including hip: Secondary | ICD-10-CM | POA: Diagnosis not present

## 2016-09-09 DIAGNOSIS — C4372 Malignant melanoma of left lower limb, including hip: Secondary | ICD-10-CM | POA: Diagnosis not present

## 2016-09-09 DIAGNOSIS — C439 Malignant melanoma of skin, unspecified: Secondary | ICD-10-CM | POA: Diagnosis not present

## 2016-09-13 DIAGNOSIS — R911 Solitary pulmonary nodule: Secondary | ICD-10-CM | POA: Diagnosis not present

## 2016-09-13 DIAGNOSIS — R5381 Other malaise: Secondary | ICD-10-CM | POA: Diagnosis not present

## 2016-09-13 DIAGNOSIS — D6489 Other specified anemias: Secondary | ICD-10-CM | POA: Diagnosis not present

## 2016-09-13 DIAGNOSIS — C439 Malignant melanoma of skin, unspecified: Secondary | ICD-10-CM | POA: Diagnosis not present

## 2016-09-13 DIAGNOSIS — C4372 Malignant melanoma of left lower limb, including hip: Secondary | ICD-10-CM | POA: Diagnosis not present

## 2016-09-13 DIAGNOSIS — C7951 Secondary malignant neoplasm of bone: Secondary | ICD-10-CM | POA: Diagnosis not present

## 2016-09-20 DIAGNOSIS — C4372 Malignant melanoma of left lower limb, including hip: Secondary | ICD-10-CM | POA: Diagnosis not present

## 2016-09-20 DIAGNOSIS — C7951 Secondary malignant neoplasm of bone: Secondary | ICD-10-CM | POA: Diagnosis not present

## 2016-09-20 DIAGNOSIS — R5381 Other malaise: Secondary | ICD-10-CM | POA: Diagnosis not present

## 2016-09-22 DIAGNOSIS — Z5112 Encounter for antineoplastic immunotherapy: Secondary | ICD-10-CM | POA: Diagnosis not present

## 2016-09-22 DIAGNOSIS — R5381 Other malaise: Secondary | ICD-10-CM | POA: Diagnosis not present

## 2016-09-22 DIAGNOSIS — D6489 Other specified anemias: Secondary | ICD-10-CM | POA: Diagnosis not present

## 2016-09-22 DIAGNOSIS — C4372 Malignant melanoma of left lower limb, including hip: Secondary | ICD-10-CM | POA: Diagnosis not present

## 2016-09-22 DIAGNOSIS — C7951 Secondary malignant neoplasm of bone: Secondary | ICD-10-CM | POA: Diagnosis not present

## 2016-10-06 DIAGNOSIS — C7951 Secondary malignant neoplasm of bone: Secondary | ICD-10-CM | POA: Diagnosis not present

## 2016-10-06 DIAGNOSIS — D6489 Other specified anemias: Secondary | ICD-10-CM | POA: Diagnosis not present

## 2016-10-06 DIAGNOSIS — Z5112 Encounter for antineoplastic immunotherapy: Secondary | ICD-10-CM | POA: Diagnosis not present

## 2016-10-06 DIAGNOSIS — C4372 Malignant melanoma of left lower limb, including hip: Secondary | ICD-10-CM | POA: Diagnosis not present

## 2016-10-06 DIAGNOSIS — R5381 Other malaise: Secondary | ICD-10-CM | POA: Diagnosis not present

## 2016-10-12 ENCOUNTER — Telehealth: Payer: Self-pay | Admitting: Pulmonary Disease

## 2016-10-12 NOTE — Telephone Encounter (Signed)
Will route message to Leigh for follow up. 

## 2016-10-13 NOTE — Telephone Encounter (Signed)
Papers have been received and placed on SN desk to review.  Will hold in my box and follow up with SN about these forms on Monday.

## 2016-10-14 ENCOUNTER — Other Ambulatory Visit: Payer: Self-pay | Admitting: Pulmonary Disease

## 2016-10-19 NOTE — Telephone Encounter (Signed)
Leigh have these forms been filled out?

## 2016-10-20 DIAGNOSIS — C7951 Secondary malignant neoplasm of bone: Secondary | ICD-10-CM | POA: Diagnosis not present

## 2016-10-20 DIAGNOSIS — R5381 Other malaise: Secondary | ICD-10-CM | POA: Diagnosis not present

## 2016-10-20 DIAGNOSIS — C4372 Malignant melanoma of left lower limb, including hip: Secondary | ICD-10-CM | POA: Diagnosis not present

## 2016-10-20 DIAGNOSIS — J449 Chronic obstructive pulmonary disease, unspecified: Secondary | ICD-10-CM | POA: Diagnosis not present

## 2016-10-20 DIAGNOSIS — D6489 Other specified anemias: Secondary | ICD-10-CM | POA: Diagnosis not present

## 2016-10-20 DIAGNOSIS — Z5112 Encounter for antineoplastic immunotherapy: Secondary | ICD-10-CM | POA: Diagnosis not present

## 2016-10-20 DIAGNOSIS — C439 Malignant melanoma of skin, unspecified: Secondary | ICD-10-CM | POA: Diagnosis not present

## 2016-10-20 DIAGNOSIS — I272 Pulmonary hypertension, unspecified: Secondary | ICD-10-CM | POA: Diagnosis not present

## 2016-10-20 DIAGNOSIS — G4733 Obstructive sleep apnea (adult) (pediatric): Secondary | ICD-10-CM | POA: Diagnosis not present

## 2016-10-23 ENCOUNTER — Other Ambulatory Visit: Payer: Self-pay | Admitting: Pulmonary Disease

## 2016-10-24 NOTE — Telephone Encounter (Signed)
Have these forms been completed?  

## 2016-10-25 ENCOUNTER — Encounter: Payer: Self-pay | Admitting: *Deleted

## 2016-10-25 DIAGNOSIS — E039 Hypothyroidism, unspecified: Secondary | ICD-10-CM | POA: Diagnosis not present

## 2016-10-25 DIAGNOSIS — E7800 Pure hypercholesterolemia, unspecified: Secondary | ICD-10-CM | POA: Diagnosis not present

## 2016-10-25 DIAGNOSIS — I10 Essential (primary) hypertension: Secondary | ICD-10-CM | POA: Diagnosis not present

## 2016-10-25 NOTE — Telephone Encounter (Signed)
No sign of these form on SN's cart and Leigh does not have them ATC pt/spouse to see if forms have been received - line rang numerous times with no answer and patient does not have VM set up yet.  Pt is active in [place], will send pt e-mail so that message can hopefully be resolved

## 2016-10-27 NOTE — Telephone Encounter (Signed)
ATC pt on both numbers we have listed unable to leave a vm on either.

## 2016-10-28 ENCOUNTER — Other Ambulatory Visit: Payer: Self-pay | Admitting: Pulmonary Disease

## 2016-10-28 NOTE — Telephone Encounter (Signed)
Spiriva Handihaler 10/28/16 refill request was not authorized as a prescription was sent on 10/24/16. [facility] [phone]) was contacted who stated 10/24/16 prescription was received. Nothing further is needed.

## 2016-10-31 NOTE — Telephone Encounter (Signed)
ATC pt on mobile message stated vm is not set up yet. Will need to try to call patient back

## 2016-11-02 NOTE — Telephone Encounter (Signed)
ATC, no voicemail set up. Pt has not responded to the Mychart e-mail yet.  WCB

## 2016-11-03 DIAGNOSIS — Z5112 Encounter for antineoplastic immunotherapy: Secondary | ICD-10-CM | POA: Diagnosis not present

## 2016-11-03 DIAGNOSIS — D6489 Other specified anemias: Secondary | ICD-10-CM | POA: Diagnosis not present

## 2016-11-03 DIAGNOSIS — C4372 Malignant melanoma of left lower limb, including hip: Secondary | ICD-10-CM | POA: Diagnosis not present

## 2016-11-03 DIAGNOSIS — E7800 Pure hypercholesterolemia, unspecified: Secondary | ICD-10-CM | POA: Diagnosis not present

## 2016-11-03 DIAGNOSIS — J449 Chronic obstructive pulmonary disease, unspecified: Secondary | ICD-10-CM | POA: Diagnosis not present

## 2016-11-03 DIAGNOSIS — I495 Sick sinus syndrome: Secondary | ICD-10-CM | POA: Diagnosis not present

## 2016-11-03 DIAGNOSIS — I482 Chronic atrial fibrillation: Secondary | ICD-10-CM | POA: Diagnosis not present

## 2016-11-03 DIAGNOSIS — C7951 Secondary malignant neoplasm of bone: Secondary | ICD-10-CM | POA: Diagnosis not present

## 2016-11-03 DIAGNOSIS — Z7901 Long term (current) use of anticoagulants: Secondary | ICD-10-CM | POA: Diagnosis not present

## 2016-11-03 DIAGNOSIS — I10 Essential (primary) hypertension: Secondary | ICD-10-CM | POA: Diagnosis not present

## 2016-11-03 DIAGNOSIS — I739 Peripheral vascular disease, unspecified: Secondary | ICD-10-CM | POA: Diagnosis not present

## 2016-11-03 DIAGNOSIS — G2581 Restless legs syndrome: Secondary | ICD-10-CM | POA: Diagnosis not present

## 2016-11-03 DIAGNOSIS — R5381 Other malaise: Secondary | ICD-10-CM | POA: Diagnosis not present

## 2016-11-03 DIAGNOSIS — M21372 Foot drop, left foot: Secondary | ICD-10-CM | POA: Diagnosis not present

## 2016-11-03 DIAGNOSIS — R600 Localized edema: Secondary | ICD-10-CM | POA: Diagnosis not present

## 2016-11-03 NOTE — Telephone Encounter (Signed)
atc phone went straight to vm, vm is not set up

## 2016-11-04 NOTE — Telephone Encounter (Signed)
These forms have been received and reviewed by SN.

## 2016-11-10 DIAGNOSIS — I10 Essential (primary) hypertension: Secondary | ICD-10-CM | POA: Diagnosis not present

## 2016-11-10 DIAGNOSIS — E785 Hyperlipidemia, unspecified: Secondary | ICD-10-CM | POA: Diagnosis not present

## 2016-11-10 DIAGNOSIS — R0989 Other specified symptoms and signs involving the circulatory and respiratory systems: Secondary | ICD-10-CM | POA: Diagnosis not present

## 2016-11-10 DIAGNOSIS — Z87891 Personal history of nicotine dependence: Secondary | ICD-10-CM | POA: Diagnosis not present

## 2016-11-17 DIAGNOSIS — J449 Chronic obstructive pulmonary disease, unspecified: Secondary | ICD-10-CM | POA: Diagnosis not present

## 2016-11-17 DIAGNOSIS — E039 Hypothyroidism, unspecified: Secondary | ICD-10-CM | POA: Diagnosis not present

## 2016-11-17 DIAGNOSIS — C4372 Malignant melanoma of left lower limb, including hip: Secondary | ICD-10-CM | POA: Diagnosis not present

## 2016-11-17 DIAGNOSIS — C779 Secondary and unspecified malignant neoplasm of lymph node, unspecified: Secondary | ICD-10-CM | POA: Diagnosis not present

## 2016-11-17 DIAGNOSIS — R600 Localized edema: Secondary | ICD-10-CM | POA: Diagnosis not present

## 2016-11-17 DIAGNOSIS — R5381 Other malaise: Secondary | ICD-10-CM | POA: Diagnosis not present

## 2016-11-17 DIAGNOSIS — I482 Chronic atrial fibrillation: Secondary | ICD-10-CM | POA: Diagnosis not present

## 2016-11-17 DIAGNOSIS — M21372 Foot drop, left foot: Secondary | ICD-10-CM | POA: Diagnosis not present

## 2016-11-17 DIAGNOSIS — D6489 Other specified anemias: Secondary | ICD-10-CM | POA: Diagnosis not present

## 2016-11-17 DIAGNOSIS — C7951 Secondary malignant neoplasm of bone: Secondary | ICD-10-CM | POA: Diagnosis not present

## 2016-11-17 DIAGNOSIS — Z5112 Encounter for antineoplastic immunotherapy: Secondary | ICD-10-CM | POA: Diagnosis not present

## 2016-11-18 ENCOUNTER — Telehealth: Payer: Self-pay | Admitting: Internal Medicine

## 2016-11-18 DIAGNOSIS — G4733 Obstructive sleep apnea (adult) (pediatric): Secondary | ICD-10-CM | POA: Diagnosis not present

## 2016-11-18 DIAGNOSIS — J449 Chronic obstructive pulmonary disease, unspecified: Secondary | ICD-10-CM | POA: Diagnosis not present

## 2016-11-18 DIAGNOSIS — C439 Malignant melanoma of skin, unspecified: Secondary | ICD-10-CM | POA: Diagnosis not present

## 2016-11-18 DIAGNOSIS — I272 Pulmonary hypertension, unspecified: Secondary | ICD-10-CM | POA: Diagnosis not present

## 2016-11-18 NOTE — Telephone Encounter (Signed)
Patient wife calling, states that for the past two mornings when patient takes off his CPAP machine his heart has been 34 &36. Mrs. Lesniak states that she was told by his pulmonary doctor that something is wrong with his pacemaker and that it should be automatically regulating patient heartbeat. Patient wife would like some advice.

## 2016-11-18 NOTE — Telephone Encounter (Signed)
Spoke with patient's wife and explained that since they transferred care to [facility] 08/03/2016 I could not check his pacemaker from home. However, she states that she was checking his hr on a pulse ox monitor. I explained that because he has a history of permanent afib it is very possible that this reading is inaccurate. She verbalized understanding. She reports that he does not feel any worse than his baseline as he has several other co-morbities that have impacted his health. She states that she will call the managing cardiology office on Monday but they are closed Fridays which is why she chose to call us. I encouraged her to check is hr manually to get an accurate HR, she verbalized understanding.

## 2016-11-22 ENCOUNTER — Ambulatory Visit: Payer: Medicare Other | Admitting: Pulmonary Disease

## 2016-11-24 ENCOUNTER — Other Ambulatory Visit: Payer: Self-pay | Admitting: Internal Medicine

## 2016-12-01 DIAGNOSIS — C4372 Malignant melanoma of left lower limb, including hip: Secondary | ICD-10-CM | POA: Diagnosis not present

## 2016-12-01 DIAGNOSIS — J449 Chronic obstructive pulmonary disease, unspecified: Secondary | ICD-10-CM | POA: Diagnosis not present

## 2016-12-01 DIAGNOSIS — R5381 Other malaise: Secondary | ICD-10-CM | POA: Diagnosis not present

## 2016-12-01 DIAGNOSIS — Z5112 Encounter for antineoplastic immunotherapy: Secondary | ICD-10-CM | POA: Diagnosis not present

## 2016-12-01 DIAGNOSIS — D6489 Other specified anemias: Secondary | ICD-10-CM | POA: Diagnosis not present

## 2016-12-01 DIAGNOSIS — C7951 Secondary malignant neoplasm of bone: Secondary | ICD-10-CM | POA: Diagnosis not present

## 2016-12-06 DIAGNOSIS — Z45010 Encounter for checking and testing of cardiac pacemaker pulse generator [battery]: Secondary | ICD-10-CM | POA: Diagnosis not present

## 2016-12-13 ENCOUNTER — Other Ambulatory Visit: Payer: Self-pay | Admitting: Pulmonary Disease

## 2016-12-15 DIAGNOSIS — J449 Chronic obstructive pulmonary disease, unspecified: Secondary | ICD-10-CM | POA: Diagnosis not present

## 2016-12-15 DIAGNOSIS — C7951 Secondary malignant neoplasm of bone: Secondary | ICD-10-CM | POA: Diagnosis not present

## 2016-12-15 DIAGNOSIS — C4372 Malignant melanoma of left lower limb, including hip: Secondary | ICD-10-CM | POA: Diagnosis not present

## 2016-12-15 DIAGNOSIS — R5381 Other malaise: Secondary | ICD-10-CM | POA: Diagnosis not present

## 2016-12-15 DIAGNOSIS — Z5112 Encounter for antineoplastic immunotherapy: Secondary | ICD-10-CM | POA: Diagnosis not present

## 2016-12-15 DIAGNOSIS — D6489 Other specified anemias: Secondary | ICD-10-CM | POA: Diagnosis not present

## 2016-12-20 DIAGNOSIS — C439 Malignant melanoma of skin, unspecified: Secondary | ICD-10-CM | POA: Diagnosis not present

## 2016-12-20 DIAGNOSIS — C7951 Secondary malignant neoplasm of bone: Secondary | ICD-10-CM | POA: Diagnosis not present

## 2016-12-20 DIAGNOSIS — C4372 Malignant melanoma of left lower limb, including hip: Secondary | ICD-10-CM | POA: Diagnosis not present

## 2016-12-29 DIAGNOSIS — C4372 Malignant melanoma of left lower limb, including hip: Secondary | ICD-10-CM | POA: Diagnosis not present

## 2017-01-08 ENCOUNTER — Inpatient Hospital Stay (HOSPITAL_COMMUNITY)
Admission: EM | Admit: 2017-01-08 | Discharge: 2017-01-10 | DRG: 189 | Disposition: A | Discharge status: Hospice | Payer: Medicare Other | Attending: Internal Medicine | Admitting: Internal Medicine

## 2017-01-08 ENCOUNTER — Encounter (HOSPITAL_COMMUNITY): Payer: Self-pay | Admitting: Internal Medicine

## 2017-01-08 DIAGNOSIS — I50810 Right heart failure, unspecified: Secondary | ICD-10-CM | POA: Diagnosis present

## 2017-01-08 DIAGNOSIS — C439 Malignant melanoma of skin, unspecified: Secondary | ICD-10-CM | POA: Diagnosis present

## 2017-01-08 DIAGNOSIS — J449 Chronic obstructive pulmonary disease, unspecified: Secondary | ICD-10-CM | POA: Diagnosis present

## 2017-01-08 DIAGNOSIS — Z79899 Other long term (current) drug therapy: Secondary | ICD-10-CM

## 2017-01-08 DIAGNOSIS — J9611 Chronic respiratory failure with hypoxia: Secondary | ICD-10-CM | POA: Diagnosis not present

## 2017-01-08 DIAGNOSIS — I2510 Atherosclerotic heart disease of native coronary artery without angina pectoris: Secondary | ICD-10-CM | POA: Diagnosis present

## 2017-01-08 DIAGNOSIS — Z515 Encounter for palliative care: Secondary | ICD-10-CM | POA: Diagnosis not present

## 2017-01-08 DIAGNOSIS — Z9981 Dependence on supplemental oxygen: Secondary | ICD-10-CM

## 2017-01-08 DIAGNOSIS — Z66 Do not resuscitate: Secondary | ICD-10-CM | POA: Diagnosis not present

## 2017-01-08 DIAGNOSIS — I2729 Other secondary pulmonary hypertension: Secondary | ICD-10-CM | POA: Diagnosis present

## 2017-01-08 DIAGNOSIS — Z87891 Personal history of nicotine dependence: Secondary | ICD-10-CM

## 2017-01-08 DIAGNOSIS — C799 Secondary malignant neoplasm of unspecified site: Secondary | ICD-10-CM | POA: Diagnosis present

## 2017-01-08 DIAGNOSIS — J432 Centrilobular emphysema: Secondary | ICD-10-CM

## 2017-01-08 DIAGNOSIS — Z7951 Long term (current) use of inhaled steroids: Secondary | ICD-10-CM

## 2017-01-08 DIAGNOSIS — R0603 Acute respiratory distress: Secondary | ICD-10-CM | POA: Diagnosis not present

## 2017-01-08 DIAGNOSIS — Z951 Presence of aortocoronary bypass graft: Secondary | ICD-10-CM

## 2017-01-08 DIAGNOSIS — G629 Polyneuropathy, unspecified: Secondary | ICD-10-CM | POA: Diagnosis present

## 2017-01-08 DIAGNOSIS — J9621 Acute and chronic respiratory failure with hypoxia: Secondary | ICD-10-CM | POA: Diagnosis not present

## 2017-01-08 DIAGNOSIS — E7800 Pure hypercholesterolemia, unspecified: Secondary | ICD-10-CM | POA: Diagnosis present

## 2017-01-08 DIAGNOSIS — E43 Unspecified severe protein-calorie malnutrition: Secondary | ICD-10-CM | POA: Diagnosis present

## 2017-01-08 DIAGNOSIS — R531 Weakness: Secondary | ICD-10-CM | POA: Diagnosis not present

## 2017-01-08 DIAGNOSIS — G4733 Obstructive sleep apnea (adult) (pediatric): Secondary | ICD-10-CM | POA: Diagnosis present

## 2017-01-08 DIAGNOSIS — Z950 Presence of cardiac pacemaker: Secondary | ICD-10-CM

## 2017-01-08 DIAGNOSIS — R0902 Hypoxemia: Secondary | ICD-10-CM | POA: Diagnosis not present

## 2017-01-08 DIAGNOSIS — Z6822 Body mass index (BMI) 22.0-22.9, adult: Secondary | ICD-10-CM

## 2017-01-08 DIAGNOSIS — Z7901 Long term (current) use of anticoagulants: Secondary | ICD-10-CM

## 2017-01-08 DIAGNOSIS — I110 Hypertensive heart disease with heart failure: Secondary | ICD-10-CM | POA: Diagnosis present

## 2017-01-08 MED ORDER — ONDANSETRON HCL 4 MG PO TABS: 4.0000 mg | ORAL_TABLET | Freq: Four times a day (QID) | ORAL | Status: DC | PRN

## 2017-01-08 MED ORDER — TIOTROPIUM BROMIDE MONOHYDRATE 18 MCG IN CAPS
18.0000 ug | ORAL_CAPSULE | Freq: Every day | RESPIRATORY_TRACT | Status: DC
  Administered 2017-01-09 – 2017-01-10 (×2): 18 ug via RESPIRATORY_TRACT
  Filled 2017-01-08: qty 5

## 2017-01-08 MED ORDER — GLYCOPYRROLATE 1 MG PO TABS
1.0000 mg | ORAL_TABLET | ORAL | Status: DC | PRN
  Filled 2017-01-08: qty 1

## 2017-01-08 MED ORDER — ONDANSETRON HCL 4 MG/2ML IJ SOLN
4.0000 mg | Freq: Four times a day (QID) | INTRAMUSCULAR | Status: DC | PRN
Start: 2017-01-08 — End: 2017-01-10

## 2017-01-08 MED ORDER — ACETAMINOPHEN 325 MG PO TABS: 650.0000 mg | ORAL_TABLET | Freq: Four times a day (QID) | ORAL | Status: DC | PRN

## 2017-01-08 MED ORDER — BIOTENE DRY MOUTH MT LIQD: 15.0000 mL | OROMUCOSAL | Status: DC | PRN

## 2017-01-08 MED ORDER — LORAZEPAM 1 MG PO TABS: 1.0000 mg | ORAL_TABLET | ORAL | Status: DC | PRN

## 2017-01-08 MED ORDER — SODIUM CHLORIDE 0.9% FLUSH: 3.0000 mL | INTRAVENOUS | Status: DC | PRN

## 2017-01-08 MED ORDER — MORPHINE SULFATE (PF) 2 MG/ML IV SOLN: 1.0000 mg | INTRAVENOUS | Status: DC | PRN

## 2017-01-08 MED ORDER — LORAZEPAM 2 MG/ML PO CONC: 1.0000 mg | ORAL | Status: DC | PRN

## 2017-01-08 MED ORDER — MOMETASONE FURO-FORMOTEROL FUM 200-5 MCG/ACT IN AERO
2.0000 | INHALATION_SPRAY | Freq: Two times a day (BID) | RESPIRATORY_TRACT | Status: DC
  Administered 2017-01-09 – 2017-01-10 (×3): 2 via RESPIRATORY_TRACT
  Filled 2017-01-08: qty 8.8

## 2017-01-08 MED ORDER — IPRATROPIUM-ALBUTEROL 0.5-2.5 (3) MG/3ML IN SOLN: 3.0000 mL | Freq: Four times a day (QID) | RESPIRATORY_TRACT | Status: DC | PRN

## 2017-01-08 MED ORDER — GABAPENTIN 300 MG PO CAPS
300.0000 mg | ORAL_CAPSULE | Freq: Two times a day (BID) | ORAL | Status: DC
  Administered 2017-01-09 (×3): 300 mg via ORAL
  Filled 2017-01-08 (×3): qty 1

## 2017-01-08 MED ORDER — SODIUM CHLORIDE 0.9% FLUSH
3.0000 mL | Freq: Two times a day (BID) | INTRAVENOUS | Status: DC
  Administered 2017-01-09 (×3): 3 mL via INTRAVENOUS

## 2017-01-08 MED ORDER — IPRATROPIUM-ALBUTEROL 0.5-2.5 (3) MG/3ML IN SOLN
3.0000 mL | Freq: Once | RESPIRATORY_TRACT | Status: AC
  Administered 2017-01-08: 3 mL via RESPIRATORY_TRACT
  Filled 2017-01-08: qty 3

## 2017-01-08 MED ORDER — ACETAMINOPHEN 650 MG RE SUPP: 650.0000 mg | Freq: Four times a day (QID) | RECTAL | Status: DC | PRN

## 2017-01-08 MED ORDER — MORPHINE SULFATE (PF) 4 MG/ML IV SOLN
1.0000 mg | INTRAVENOUS | Status: DC | PRN
  Administered 2017-01-09 (×2): 1 mg via INTRAVENOUS
  Filled 2017-01-08 (×2): qty 1

## 2017-01-08 MED ORDER — SODIUM CHLORIDE 0.9 % IV SOLN: 250.0000 mL | INTRAVENOUS | Status: DC | PRN

## 2017-01-08 MED ORDER — GLYCOPYRROLATE 0.2 MG/ML IJ SOLN
0.2000 mg | INTRAMUSCULAR | Status: DC | PRN
  Filled 2017-01-08: qty 1

## 2017-01-08 MED ORDER — GLYCOPYRROLATE 0.2 MG/ML IJ SOLN
0.2000 mg | INTRAMUSCULAR | Status: DC | PRN
Start: 2017-01-08 — End: 2017-01-10
  Filled 2017-01-08: qty 1

## 2017-01-08 MED ORDER — ONDANSETRON 4 MG PO TBDP: 4.0000 mg | ORAL_TABLET | Freq: Four times a day (QID) | ORAL | Status: DC | PRN

## 2017-01-08 MED ORDER — LORAZEPAM 2 MG/ML IJ SOLN: 1.0000 mg | INTRAMUSCULAR | Status: DC | PRN

## 2017-01-08 MED ORDER — POLYVINYL ALCOHOL 1.4 % OP SOLN
1.0000 [drp] | Freq: Four times a day (QID) | OPHTHALMIC | Status: DC | PRN
  Filled 2017-01-08: qty 15

## 2017-01-08 MED ORDER — ONDANSETRON HCL 4 MG/2ML IJ SOLN: 4.0000 mg | Freq: Four times a day (QID) | INTRAMUSCULAR | Status: DC | PRN

## 2017-01-08 NOTE — ED Notes (Signed)
Bed: YB33 Expected date:  Expected time:  Means of arrival:  Comments: Ca pt, SOB
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
## 2017-01-09 DIAGNOSIS — Z79899 Other long term (current) drug therapy: Secondary | ICD-10-CM | POA: Diagnosis not present

## 2017-01-09 DIAGNOSIS — J9600 Acute respiratory failure, unspecified whether with hypoxia or hypercapnia: Secondary | ICD-10-CM | POA: Diagnosis not present

## 2017-01-09 DIAGNOSIS — C439 Malignant melanoma of skin, unspecified: Secondary | ICD-10-CM | POA: Diagnosis not present

## 2017-01-09 DIAGNOSIS — E46 Unspecified protein-calorie malnutrition: Secondary | ICD-10-CM | POA: Diagnosis not present

## 2017-01-09 DIAGNOSIS — Z6822 Body mass index (BMI) 22.0-22.9, adult: Secondary | ICD-10-CM | POA: Diagnosis not present

## 2017-01-09 DIAGNOSIS — I2510 Atherosclerotic heart disease of native coronary artery without angina pectoris: Secondary | ICD-10-CM | POA: Diagnosis present

## 2017-01-09 DIAGNOSIS — I50810 Right heart failure, unspecified: Secondary | ICD-10-CM | POA: Diagnosis present

## 2017-01-09 DIAGNOSIS — Z7951 Long term (current) use of inhaled steroids: Secondary | ICD-10-CM | POA: Diagnosis not present

## 2017-01-09 DIAGNOSIS — G4733 Obstructive sleep apnea (adult) (pediatric): Secondary | ICD-10-CM | POA: Diagnosis present

## 2017-01-09 DIAGNOSIS — R4182 Altered mental status, unspecified: Secondary | ICD-10-CM | POA: Diagnosis not present

## 2017-01-09 DIAGNOSIS — I2729 Other secondary pulmonary hypertension: Secondary | ICD-10-CM | POA: Diagnosis present

## 2017-01-09 DIAGNOSIS — J9610 Chronic respiratory failure, unspecified whether with hypoxia or hypercapnia: Secondary | ICD-10-CM | POA: Diagnosis not present

## 2017-01-09 DIAGNOSIS — J449 Chronic obstructive pulmonary disease, unspecified: Secondary | ICD-10-CM | POA: Diagnosis present

## 2017-01-09 DIAGNOSIS — Z9981 Dependence on supplemental oxygen: Secondary | ICD-10-CM | POA: Diagnosis not present

## 2017-01-09 DIAGNOSIS — Z951 Presence of aortocoronary bypass graft: Secondary | ICD-10-CM | POA: Diagnosis not present

## 2017-01-09 DIAGNOSIS — Z950 Presence of cardiac pacemaker: Secondary | ICD-10-CM | POA: Diagnosis not present

## 2017-01-09 DIAGNOSIS — G629 Polyneuropathy, unspecified: Secondary | ICD-10-CM | POA: Diagnosis present

## 2017-01-09 DIAGNOSIS — E43 Unspecified severe protein-calorie malnutrition: Secondary | ICD-10-CM | POA: Diagnosis present

## 2017-01-09 DIAGNOSIS — Z515 Encounter for palliative care: Secondary | ICD-10-CM | POA: Diagnosis not present

## 2017-01-09 DIAGNOSIS — J9611 Chronic respiratory failure with hypoxia: Secondary | ICD-10-CM | POA: Diagnosis not present

## 2017-01-09 DIAGNOSIS — Z7901 Long term (current) use of anticoagulants: Secondary | ICD-10-CM | POA: Diagnosis not present

## 2017-01-09 DIAGNOSIS — E7800 Pure hypercholesterolemia, unspecified: Secondary | ICD-10-CM | POA: Diagnosis present

## 2017-01-09 DIAGNOSIS — C799 Secondary malignant neoplasm of unspecified site: Secondary | ICD-10-CM | POA: Diagnosis present

## 2017-01-09 DIAGNOSIS — I110 Hypertensive heart disease with heart failure: Secondary | ICD-10-CM | POA: Diagnosis present

## 2017-01-09 DIAGNOSIS — Z66 Do not resuscitate: Secondary | ICD-10-CM | POA: Diagnosis present

## 2017-01-09 DIAGNOSIS — J301 Allergic rhinitis due to pollen: Secondary | ICD-10-CM | POA: Diagnosis not present

## 2017-01-09 DIAGNOSIS — Z87891 Personal history of nicotine dependence: Secondary | ICD-10-CM | POA: Diagnosis not present

## 2017-01-09 DIAGNOSIS — J9621 Acute and chronic respiratory failure with hypoxia: Secondary | ICD-10-CM | POA: Diagnosis present

## 2017-01-09 DIAGNOSIS — C7800 Secondary malignant neoplasm of unspecified lung: Secondary | ICD-10-CM | POA: Diagnosis not present

## 2017-01-09 DIAGNOSIS — C7951 Secondary malignant neoplasm of bone: Secondary | ICD-10-CM | POA: Diagnosis not present

## 2017-01-09 DIAGNOSIS — J432 Centrilobular emphysema: Secondary | ICD-10-CM | POA: Diagnosis not present

## 2017-01-09 LAB — MRSA PCR SCREENING: MRSA by PCR: NEGATIVE

## 2017-01-09 MED ORDER — IPRATROPIUM-ALBUTEROL 0.5-2.5 (3) MG/3ML IN SOLN
3.0000 mL | RESPIRATORY_TRACT | Status: DC
  Administered 2017-01-09 (×4): 3 mL via RESPIRATORY_TRACT
  Filled 2017-01-09 (×4): qty 3

## 2017-01-09 MED ORDER — IPRATROPIUM-ALBUTEROL 0.5-2.5 (3) MG/3ML IN SOLN
3.0000 mL | RESPIRATORY_TRACT | Status: DC
  Administered 2017-01-09 – 2017-01-10 (×4): 3 mL via RESPIRATORY_TRACT
  Filled 2017-01-09 (×4): qty 3

## 2017-01-09 MED ORDER — ORAL CARE MOUTH RINSE
15.0000 mL | Freq: Two times a day (BID) | OROMUCOSAL | Status: DC
  Administered 2017-01-09 – 2017-01-10 (×3): 15 mL via OROMUCOSAL

## 2017-01-09 NOTE — Care Management Note (Signed)
Case Management Note  Patient Details  Name: Roy Banks MRN: [id number] Date of Birth: [birth year]  Subjective/Objective:                  Resp.distress and bipap  Action/Plan: Date:  January 09, 2017 Chart reviewed for concurrent status and case management needs. Will continue to follow patient progress. Discharge Planning: following for needs Expected discharge date: [id number] [name], BSN, [place], [place]  Expected Discharge Date:                  Expected Discharge Plan:  Home/Self Care  In-House Referral:     Discharge planning Services  CM Consult  Post Acute Care Choice:    Choice offered to:     DME Arranged:    DME Agency:     HH Arranged:    HH Agency:     Status of Service:  In process, will continue to follow  If discussed at Long Length of Stay Meetings, dates discussed:    Additional Comments:  [name], RN 01/09/2017, 8:56 AM

## 2017-01-09 NOTE — Progress Notes (Signed)
Date: January 09, 2017 tcf-Evaa Davis/family is wanting patient to be transferred to [facility]/ no beds available at this time.Vernia Buff, [phone]

## 2017-01-09 NOTE — Progress Notes (Signed)
Palliative Medicine RN Note: Consult order noted; paged Dr Aileen Fass. Due to high consult volume, there may be a delay seeing this patient. If patient and family are interested in hospice with clear goals, PMT recommends referral to [facility] for home hospice or SW for inpt hospice as is appropriate for this patient.  Should you need other immediate recommendations in the interim, please call our office at [phone] between the hours of 0700-1900.  Marjie Skiff Hafiz Irion, RN, BSN, [facility] 01/09/2017 9:36 AM Cell [phone] 8:00-4:00 Monday-Friday Office [phone]
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
## 2017-01-09 NOTE — Progress Notes (Signed)
D/w Dr. Venetia Constable.  He has had a chance to evaluate patient and no need for formal palliative consult at this time.  As always, we remain available as needed.  Please call or re-consult palliative if we can be of assistance.  Micheline Rough, MD [place] Palliative Medicine Team [phone]  NO CHARGE NOTE
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
## 2017-01-10 MED ORDER — MORPHINE SULFATE (PF) 4 MG/ML IV SOLN
3.0000 mg | INTRAVENOUS | Status: DC
  Administered 2017-01-10 (×4): 3 mg via INTRAVENOUS
  Filled 2017-01-10 (×4): qty 1

## 2017-01-10 MED ORDER — LORAZEPAM 1 MG PO TABS: 1.0000 mg | ORAL_TABLET | ORAL | 0 refills | Status: AC | PRN

## 2017-01-10 MED ORDER — MORPHINE SULFATE (PF) 4 MG/ML IV SOLN: 4.0000 mg | INTRAVENOUS | Status: DC | PRN

## 2017-01-10 MED ORDER — MORPHINE SULFATE (PF) 4 MG/ML IV SOLN: 3.0000 mg | INTRAVENOUS | 0 refills | Status: AC

## 2017-01-10 MED ORDER — MORPHINE SULFATE (PF) 4 MG/ML IV SOLN: 2.0000 mg | INTRAVENOUS | Status: DC | PRN

## 2017-01-10 MED ORDER — MORPHINE SULFATE (PF) 4 MG/ML IV SOLN
4.0000 mg | Freq: Once | INTRAVENOUS | Status: AC
  Administered 2017-01-10: 4 mg via INTRAVENOUS
  Filled 2017-01-10: qty 1
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
## 2017-01-10 NOTE — Clinical Social Work Note (Signed)
Clinical Social Work Assessment  Patient Details  Name: Roy Banks MRN: [id number] Date of Birth: [birth year]  Date of referral:  01/10/17               Reason for consult:  End of Life/Hospice                Permission sought to share information with:  Family Supports Permission granted to share information::  Yes, Verbal Permission Granted  Name::     Wife Financial planner::     Relationship::     Contact Information:     Housing/Transportation Living arrangements for the past 2 months:  Single Family Home Source of Information:  Medical Team, Spouse, Patient Patient Interpreter Needed:  None Criminal Activity/Legal Involvement Pertinent to Current Situation/Hospitalization:  No - Comment as needed Significant Relationships:  Neighbor, Spouse Lives with:  Spouse Do you feel safe going back to the place where you live?  Yes Need for family participation in patient care:  Yes (Comment) (wife involved in decision making)  Care giving concerns:  Pt from home where he resides with his wife. No caregiving concerns reported per pt.  Wife reports she feels pt has declined to level she cannot manage at home.    Social Worker assessment / plan:  CSW consulted for assistance with referring pt for residential hospice placement. Met with pt and wife at bedside. Both have had multiple discussions with medical team and feel comfortable with recommendation to transition to full comfort care. Residential hospice deemed appropriate per attending.  Pt/family prefer [facility] as family resides in [place]. CSW made referral and will follow.  Employment status:  Retired Forensic scientist:  Medicare PT Recommendations:  Not assessed at this time [place] / Referral to community resources:  Other (Comment Required) (Hospice )  Patient/Family's Response to care:  Pt showed minimal but appropriate response (drowsy), wife expressed appreciation for pt's care  Patient/Family's  Understanding of and Emotional Response to Diagnosis, Current Treatment, and Prognosis:  Pt very drowsy at time of this interaction and showed minimal emotional response. Per medical team and hospice liaison (have met with pt yesterday), pt understanding of his prognosis and accepting. Wife demonstrates thorough understanding of pt's prognosis and plan. She states, "As long as he is here taken care of until we can get to [facility], I feel good." emotionally was appropriate to situation and highly engaged in pt's care planning.  Emotional Assessment Appearance:  Appears stated age Attitude/Demeanor/Rapport:   (cooperative but drowsy) Affect (typically observed):  Calm Orientation:  Oriented to Self, Oriented to Place, Oriented to Situation, Oriented to  Time Alcohol / Substance use:  Not Applicable Psych involvement (Current and /or in the community):  No (Comment)  Discharge Needs  Concerns to be addressed:  Discharge Planning Concerns (end of life decisions) Readmission within the last 30 days:  No Current discharge risk:  None Barriers to Discharge:  No Barriers Identified   [name], LCSW 01/10/2017, 9:44 AM  [phone]

## 2017-01-10 NOTE — Progress Notes (Signed)
Pt discharging today, will admit to [facility] for residential hospice care.  Pt's wife at bedside, both she and pt agreeable and accepting of plan. Completed admission paperwork with Hospice liaison.  Report # for RN: [phone] Pt will transport via [place] completed medical necessity form and arranged transportation.   Sharren Bridge, MSW, LCSW Clinical Social Work 01/10/2017 [phone]

## 2017-01-11 ENCOUNTER — Other Ambulatory Visit: Payer: Self-pay | Admitting: Cardiology

## 2017-01-11 ENCOUNTER — Other Ambulatory Visit: Payer: Self-pay | Admitting: Pulmonary Disease

## 2017-01-13 NOTE — Telephone Encounter (Signed)
Spoke to patient's wife she stated husband passed away yesterday 2017-02-11 with stage 4 melanoma.Sympathy expressed.

## 2017-01-23 DEATH — deceased
